# Patient Record
Sex: Male | Born: 1980 | Race: Black or African American | Hispanic: No | Marital: Married | Smoking: Current every day smoker
Health system: Southern US, Community
[De-identification: ages and names within clinical notes are randomized; demographics above are authoritative.]

## PROBLEM LIST (undated history)

## (undated) DIAGNOSIS — K259 Gastric ulcer, unspecified as acute or chronic, without hemorrhage or perforation: Secondary | ICD-10-CM

---

## 1998-03-04 ENCOUNTER — Emergency Department (HOSPITAL_COMMUNITY): Admission: EM | Admit: 1998-03-04 | Discharge: 1998-03-04 | Payer: Self-pay | Admitting: Emergency Medicine

## 2002-12-16 ENCOUNTER — Emergency Department (HOSPITAL_COMMUNITY): Admission: EM | Admit: 2002-12-16 | Discharge: 2002-12-17 | Payer: Self-pay | Admitting: Emergency Medicine

## 2002-12-17 ENCOUNTER — Encounter: Payer: Self-pay | Admitting: Emergency Medicine

## 2003-03-06 ENCOUNTER — Emergency Department (HOSPITAL_COMMUNITY): Admission: EM | Admit: 2003-03-06 | Discharge: 2003-03-06 | Payer: Self-pay | Admitting: Emergency Medicine

## 2004-08-25 ENCOUNTER — Emergency Department (HOSPITAL_COMMUNITY): Admission: EM | Admit: 2004-08-25 | Discharge: 2004-08-25 | Payer: Self-pay | Admitting: Emergency Medicine

## 2004-11-17 ENCOUNTER — Emergency Department (HOSPITAL_COMMUNITY): Admission: EM | Admit: 2004-11-17 | Discharge: 2004-11-17 | Payer: Self-pay | Admitting: Emergency Medicine

## 2004-12-14 ENCOUNTER — Emergency Department (HOSPITAL_COMMUNITY): Admission: EM | Admit: 2004-12-14 | Discharge: 2004-12-15 | Payer: Self-pay | Admitting: Emergency Medicine

## 2006-04-10 IMAGING — CT CT PELVIS W/ CM
1 of 3 series · 14 of 32 positions shown, 19 images · IV contrast (OMNI & OMNI 300 100)
Comparison: None.

CLINICAL DATA: Rectal bleeding with low back pain for the past two months.
TECHNIQUE: 100 cc Omnipaque 300 was utilized.

[Series 2: routine abdomen · axial · 0.62mm/px · z∈[-393,-43]mm · 14 of 80 slices shown, 19 images]
[im 5/80  soft-tissue]
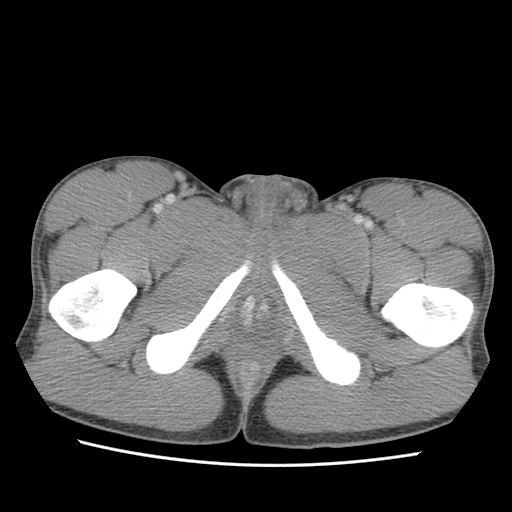
[im 5/80  bone]
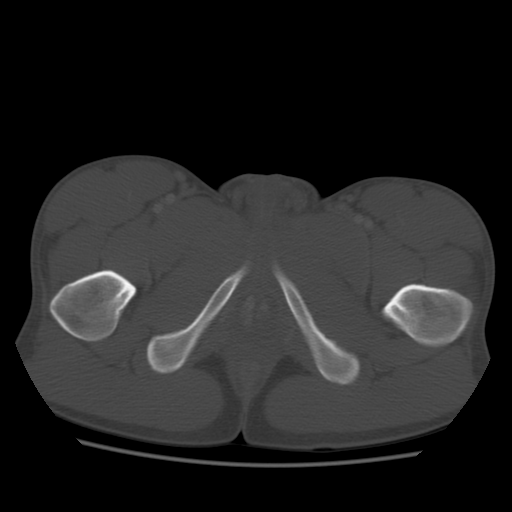
[im 13/80  soft-tissue]
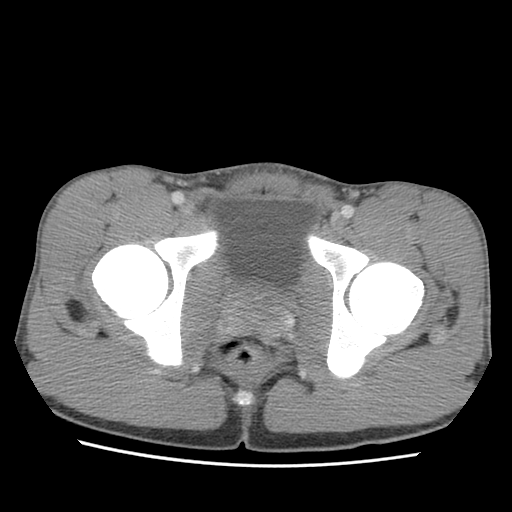
[im 17/80  soft-tissue]
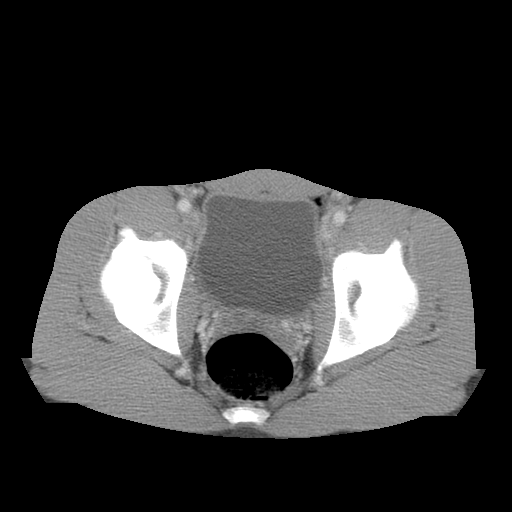
[im 21/80  soft-tissue]
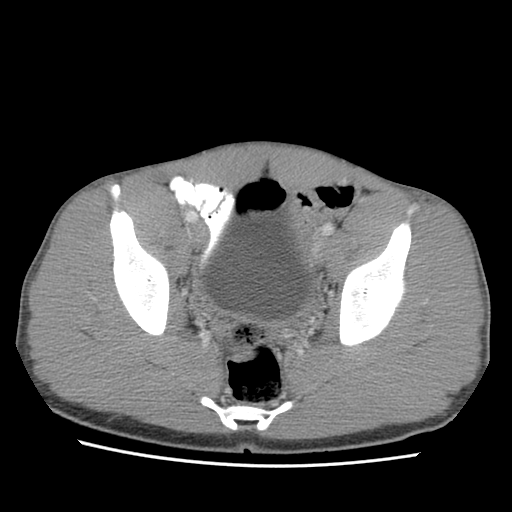
[im 30/80  soft-tissue]
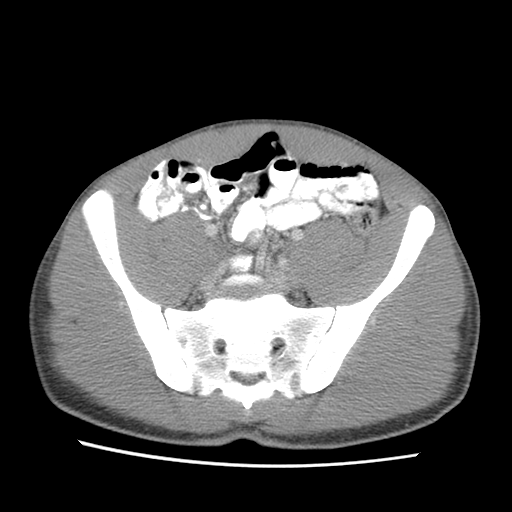
[im 34/80  soft-tissue]
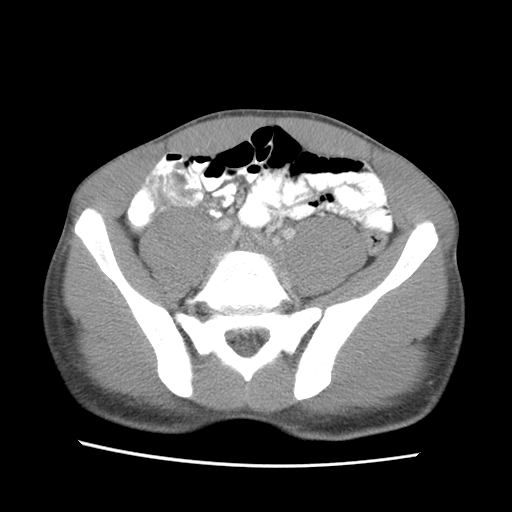
[im 42/80  soft-tissue]
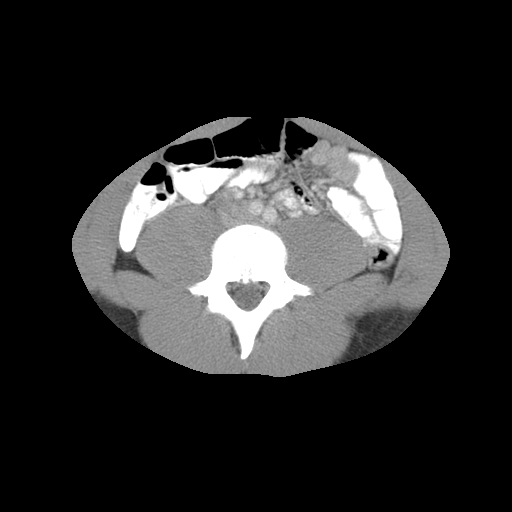
[im 46/80  soft-tissue]
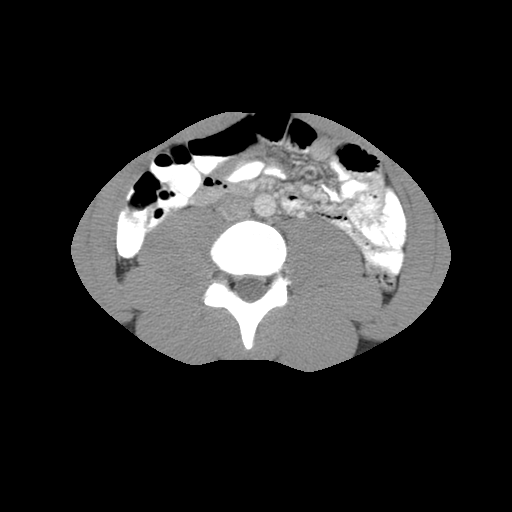
[im 50/80  soft-tissue]
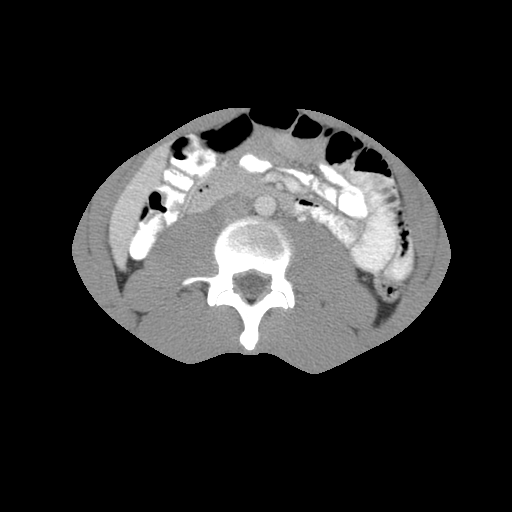
[im 50/80  bone]
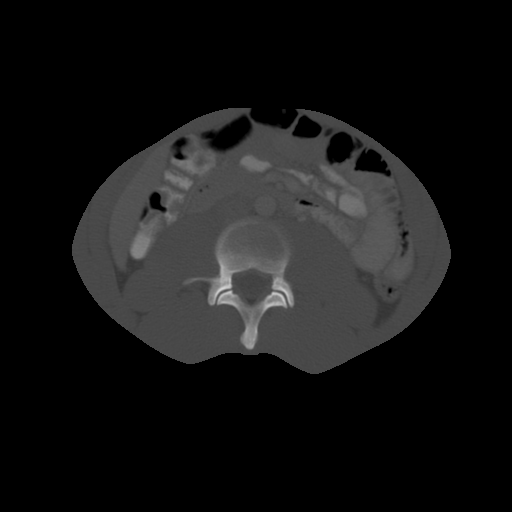
[im 59/80  soft-tissue]
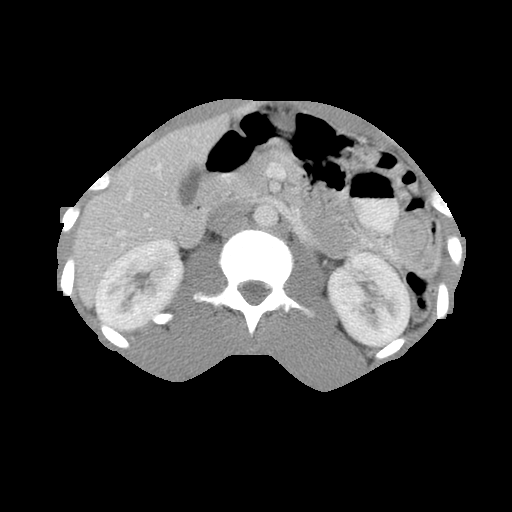
[im 63/80  soft-tissue]
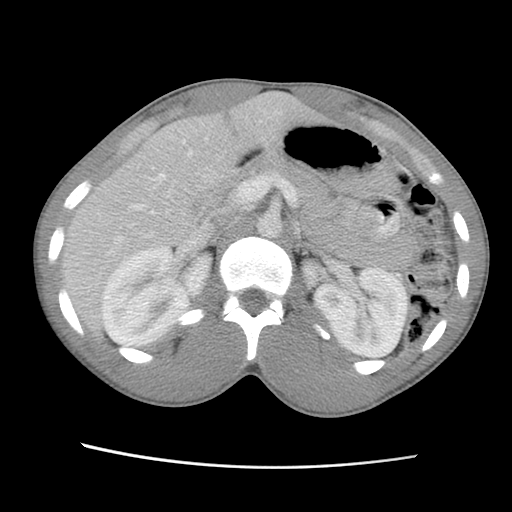
[im 63/80  lung]
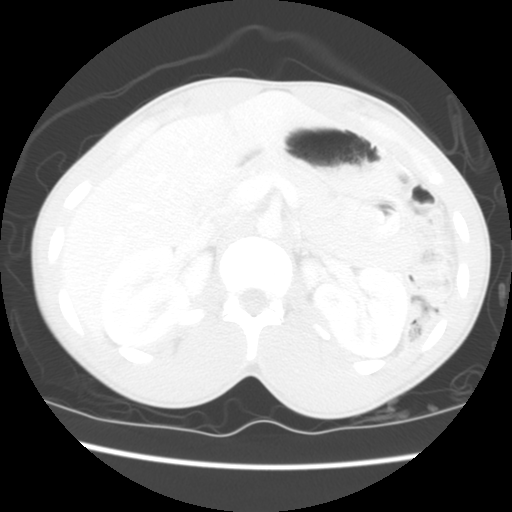
[im 67/80  soft-tissue]
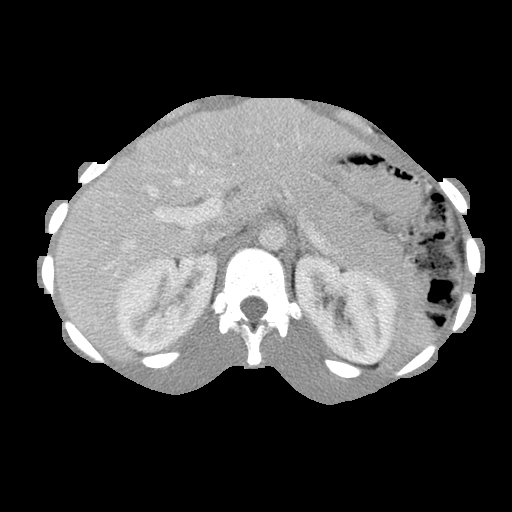
[im 67/80  lung]
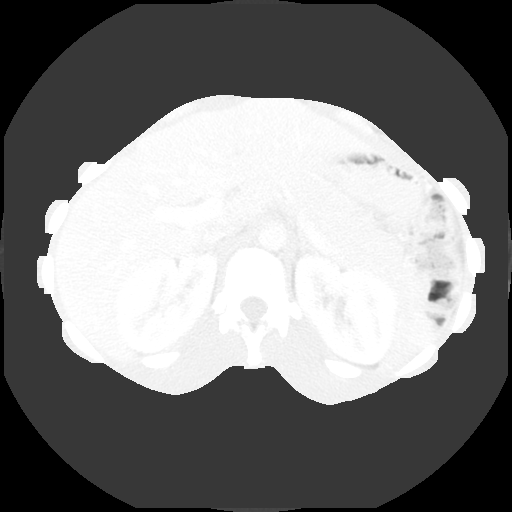
[im 71/80  lung]
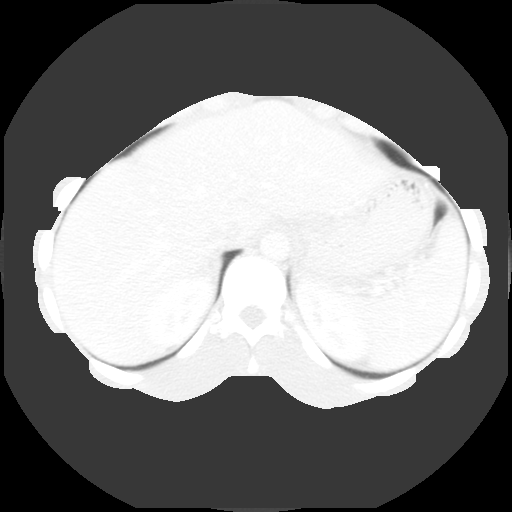
[im 75/80  soft-tissue]
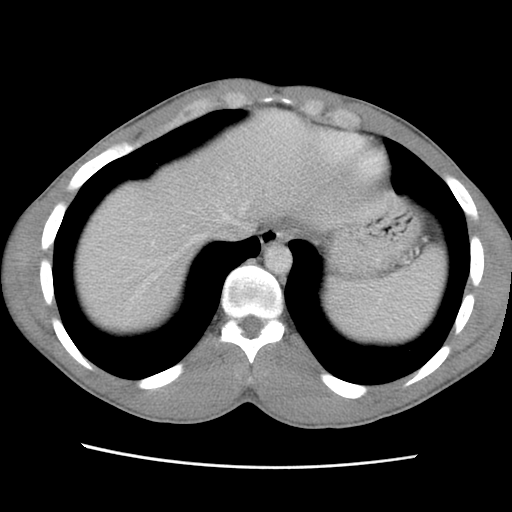
[im 75/80  lung]
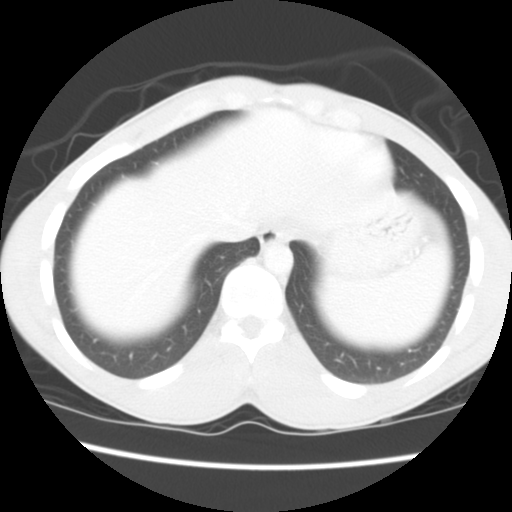

[14 of 32 positions shown; findings below may reference images not displayed]

CT ABDOMEN WITH CONTRAST:
Lung bases clear.  Liver, spleen, pancreas, adrenal glands, and kidneys are unremarkable.  No abdominal aortic aneurysm.  No bony destructive lesion.  No abnormal fluid collection or inflammatory process.  Few small bowel loops within the upper abdomen appear slightly prominent in size without thickened folds.  Significance indeterminate.
IMPRESSION: Few small bowel loops in left upper quadrant appear slightly prominent in size but without thickened folds.  Significance indeterminate.  
CT PELVIS WITH CONTRAST:
No inflammatory process detected along the appendix which fills with contrast.  Sigmoid colon is under distended and may have thickened walls related to the under distention but inflammatory process or other abnormality of such as cause for the patient?s rectal bleeding cannot be excluded.  One may consider further investigation.
IMPRESSION: Under distended sigmoid colon has slightly thickened walls which may be related to under distention as vs. inflammatory process or other abnormality.  Given the patient?s history, one may consider further investigation.

## 2007-05-10 ENCOUNTER — Emergency Department: Payer: Self-pay

## 2007-06-23 ENCOUNTER — Emergency Department (HOSPITAL_COMMUNITY): Admission: EM | Admit: 2007-06-23 | Discharge: 2007-06-23 | Payer: Self-pay | Admitting: *Deleted

## 2008-02-10 ENCOUNTER — Emergency Department (HOSPITAL_COMMUNITY): Admission: EM | Admit: 2008-02-10 | Discharge: 2008-02-10 | Payer: Self-pay | Admitting: Emergency Medicine

## 2008-10-18 ENCOUNTER — Emergency Department (HOSPITAL_COMMUNITY): Admission: EM | Admit: 2008-10-18 | Discharge: 2008-10-18 | Payer: Self-pay | Admitting: Emergency Medicine

## 2009-03-06 ENCOUNTER — Emergency Department (HOSPITAL_COMMUNITY): Admission: EM | Admit: 2009-03-06 | Discharge: 2009-03-06 | Payer: Self-pay | Admitting: Emergency Medicine

## 2009-03-09 ENCOUNTER — Emergency Department (HOSPITAL_COMMUNITY): Admission: EM | Admit: 2009-03-09 | Discharge: 2009-03-10 | Payer: Self-pay | Admitting: Emergency Medicine

## 2009-05-04 ENCOUNTER — Emergency Department (HOSPITAL_COMMUNITY): Admission: EM | Admit: 2009-05-04 | Discharge: 2009-05-04 | Payer: Self-pay | Admitting: Emergency Medicine

## 2010-01-26 ENCOUNTER — Emergency Department (HOSPITAL_COMMUNITY): Admission: EM | Admit: 2010-01-26 | Discharge: 2010-01-26 | Payer: Self-pay | Admitting: Emergency Medicine

## 2010-10-17 LAB — DIFFERENTIAL
Eosinophils Absolute: 0.1 10*3/uL (ref 0.0–0.7)
Lymphocytes Relative: 36 % (ref 12–46)
Lymphs Abs: 4.5 10*3/uL — ABNORMAL HIGH (ref 0.7–4.0)
Monocytes Relative: 5 % (ref 3–12)
Neutrophils Relative %: 58 % (ref 43–77)

## 2010-10-17 LAB — RAPID URINE DRUG SCREEN, HOSP PERFORMED
Amphetamines: NOT DETECTED
Benzodiazepines: NOT DETECTED
Tetrahydrocannabinol: POSITIVE — AB

## 2010-10-17 LAB — URINALYSIS, ROUTINE W REFLEX MICROSCOPIC
Glucose, UA: NEGATIVE mg/dL
Hgb urine dipstick: NEGATIVE
Ketones, ur: NEGATIVE mg/dL
Specific Gravity, Urine: 1.031 — ABNORMAL HIGH (ref 1.005–1.030)
Urobilinogen, UA: 1 mg/dL (ref 0.0–1.0)
pH: 6.5 (ref 5.0–8.0)

## 2010-10-17 LAB — ETHANOL: Alcohol, Ethyl (B): <5 mg/dL (ref 0–10)

## 2010-10-17 LAB — CBC
MCHC: 32.7 g/dL (ref 30.0–36.0)
MCV: 93.2 fL (ref 78.0–100.0)
Platelets: 269 10*3/uL (ref 150–400)
RBC: 4.52 MIL/uL (ref 4.22–5.81)
RDW: 14 % (ref 11.5–15.5)

## 2010-10-17 LAB — COMPREHENSIVE METABOLIC PANEL
Creatinine, Ser: 1.21 mg/dL (ref 0.4–1.5)
Glucose, Bld: 85 mg/dL (ref 70–99)
Potassium: 3.3 mEq/L — ABNORMAL LOW (ref 3.5–5.1)
Sodium: 138 mEq/L (ref 135–145)
Total Bilirubin: 0.7 mg/dL (ref 0.3–1.2)

## 2010-10-21 LAB — LIPASE, BLOOD: Lipase: 30 U/L (ref 11–59)

## 2010-10-21 LAB — COMPREHENSIVE METABOLIC PANEL
ALT: 16 U/L (ref 0–53)
Alkaline Phosphatase: 85 U/L (ref 39–117)
BUN: 8 mg/dL (ref 6–23)
Chloride: 102 mEq/L (ref 96–112)
Sodium: 139 mEq/L (ref 135–145)
Total Bilirubin: 0.7 mg/dL (ref 0.3–1.2)
Total Protein: 6.3 g/dL (ref 6.0–8.3)

## 2010-10-21 LAB — POCT I-STAT, CHEM 8
BUN: 11 mg/dL (ref 6–23)
Calcium, Ion: 1.15 mmol/L (ref 1.12–1.32)
Chloride: 100 mEq/L (ref 96–112)
Glucose, Bld: 78 mg/dL (ref 70–99)
Hemoglobin: 16 g/dL (ref 13.0–17.0)
TCO2: 30 mmol/L (ref 0–100)

## 2010-10-21 LAB — CBC
HCT: 43.6 % (ref 39.0–52.0)
Hemoglobin: 14.7 g/dL (ref 13.0–17.0)
MCHC: 33.7 g/dL (ref 30.0–36.0)
MCV: 89.7 fL (ref 78.0–100.0)
RBC: 4.86 MIL/uL (ref 4.22–5.81)
RDW: 13.1 % (ref 11.5–15.5)
WBC: 13.7 10*3/uL — ABNORMAL HIGH (ref 4.0–10.5)

## 2010-10-21 LAB — URINALYSIS, ROUTINE W REFLEX MICROSCOPIC
Bilirubin Urine: NEGATIVE
Glucose, UA: NEGATIVE mg/dL
Protein, ur: NEGATIVE mg/dL
Urobilinogen, UA: 1 mg/dL (ref 0.0–1.0)

## 2010-10-21 LAB — URINE MICROSCOPIC-ADD ON

## 2015-10-29 ENCOUNTER — Emergency Department (HOSPITAL_COMMUNITY)
Admission: EM | Admit: 2015-10-29 | Discharge: 2015-10-29 | Disposition: A | Discharge status: Home / Self Care | Payer: Self-pay | Attending: Emergency Medicine | Admitting: Emergency Medicine

## 2015-10-29 ENCOUNTER — Encounter (HOSPITAL_COMMUNITY): Payer: Self-pay | Admitting: Emergency Medicine

## 2015-10-29 ENCOUNTER — Emergency Department (HOSPITAL_COMMUNITY): Payer: Self-pay

## 2015-10-29 DIAGNOSIS — B349 Viral infection, unspecified: Secondary | ICD-10-CM | POA: Insufficient documentation

## 2015-10-29 DIAGNOSIS — F17200 Nicotine dependence, unspecified, uncomplicated: Secondary | ICD-10-CM | POA: Insufficient documentation

## 2015-10-29 DIAGNOSIS — F1410 Cocaine abuse, uncomplicated: Secondary | ICD-10-CM | POA: Insufficient documentation

## 2015-10-29 DIAGNOSIS — F1210 Cannabis abuse, uncomplicated: Secondary | ICD-10-CM | POA: Insufficient documentation

## 2015-10-29 DIAGNOSIS — Z8719 Personal history of other diseases of the digestive system: Secondary | ICD-10-CM | POA: Insufficient documentation

## 2015-10-29 HISTORY — DX: Gastric ulcer, unspecified as acute or chronic, without hemorrhage or perforation: K25.9

## 2015-10-29 LAB — RAPID URINE DRUG SCREEN, HOSP PERFORMED
AMPHETAMINES: NOT DETECTED
BARBITURATES: NOT DETECTED
BENZODIAZEPINES: NOT DETECTED
Cocaine: POSITIVE — AB
Opiates: NOT DETECTED
Tetrahydrocannabinol: POSITIVE — AB

## 2015-10-29 LAB — URINALYSIS, ROUTINE W REFLEX MICROSCOPIC
BILIRUBIN URINE: NEGATIVE
GLUCOSE, UA: NEGATIVE mg/dL
Hgb urine dipstick: NEGATIVE
KETONES UR: 15 mg/dL — AB
Leukocytes, UA: NEGATIVE
NITRITE: NEGATIVE
PH: 7.5 (ref 5.0–8.0)
PROTEIN: NEGATIVE mg/dL
SPECIFIC GRAVITY, URINE: 1.014 (ref 1.005–1.030)

## 2015-10-29 LAB — CBC
HEMATOCRIT: 45.4 % (ref 39.0–52.0)
Hemoglobin: 14.9 g/dL (ref 13.0–17.0)
MCH: 29.3 pg (ref 26.0–34.0)
MCHC: 32.8 g/dL (ref 30.0–36.0)
MCV: 89.2 fL (ref 78.0–100.0)
Platelets: 333 10*3/uL (ref 150–400)
RBC: 5.09 MIL/uL (ref 4.22–5.81)
RDW: 12.9 % (ref 11.5–15.5)
WBC: 18.7 10*3/uL — AB (ref 4.0–10.5)

## 2015-10-29 LAB — COMPREHENSIVE METABOLIC PANEL
ALBUMIN: 4 g/dL (ref 3.5–5.0)
ALT: 17 U/L (ref 17–63)
AST: 21 U/L (ref 15–41)
Alkaline Phosphatase: 63 U/L (ref 38–126)
Anion gap: 11 (ref 5–15)
CHLORIDE: 101 mmol/L (ref 101–111)
CO2: 28 mmol/L (ref 22–32)
Calcium: 9.4 mg/dL (ref 8.9–10.3)
Creatinine, Ser: 1.08 mg/dL (ref 0.61–1.24)
GFR calc Af Amer: >60 mL/min (ref >60)
GLUCOSE: 133 mg/dL — AB (ref 65–99)
POTASSIUM: 3.2 mmol/L — AB (ref 3.5–5.1)
SODIUM: 140 mmol/L (ref 135–145)
Total Bilirubin: 0.8 mg/dL (ref 0.3–1.2)
Total Protein: 7.2 g/dL (ref 6.5–8.1)

## 2015-10-29 LAB — I-STAT CG4 LACTIC ACID, ED: Lactic Acid, Venous: 1.78 mmol/L (ref 0.5–2.0)

## 2015-10-29 LAB — LIPASE, BLOOD: LIPASE: 25 U/L (ref 11–51)

## 2015-10-29 MED ORDER — SODIUM CHLORIDE 0.9 % IV BOLUS (SEPSIS)
1000.0000 mL | Freq: Once | INTRAVENOUS | Status: AC
  Administered 2015-10-29: 1000 mL via INTRAVENOUS

## 2015-10-29 MED ORDER — PROCHLORPERAZINE EDISYLATE 5 MG/ML IJ SOLN
10.0000 mg | Freq: Once | INTRAMUSCULAR | Status: AC
  Administered 2015-10-29: 10 mg via INTRAVENOUS
  Filled 2015-10-29: qty 2

## 2015-10-29 MED ORDER — GI COCKTAIL ~~LOC~~
30.0000 mL | Freq: Once | ORAL | Status: AC
  Administered 2015-10-29: 30 mL via ORAL
  Filled 2015-10-29: qty 30

## 2015-10-29 MED ORDER — KETOROLAC TROMETHAMINE 30 MG/ML IJ SOLN
30.0000 mg | Freq: Once | INTRAMUSCULAR | Status: AC
  Administered 2015-10-29: 30 mg via INTRAVENOUS
  Filled 2015-10-29: qty 1

## 2015-10-29 MED ORDER — PSEUDOEPHEDRINE HCL 30 MG PO TABS: 30.0000 mg | ORAL_TABLET | ORAL | Status: DC | PRN

## 2015-10-29 MED ORDER — FAMOTIDINE 20 MG PO TABS: 20.0000 mg | ORAL_TABLET | Freq: Two times a day (BID) | ORAL | Status: DC

## 2015-10-29 MED ORDER — ONDANSETRON 8 MG PO TBDP: 8.0000 mg | ORAL_TABLET | Freq: Three times a day (TID) | ORAL | Status: DC | PRN

## 2015-10-29 NOTE — ED Notes (Signed)
Pt sts abd pain and nasal congestion with sinus pressure x 3 days

## 2015-10-29 NOTE — ED Provider Notes (Signed)
CSN: [id number]     Arrival date & time 10/29/15  [id number] History   First MD Initiated Contact with Patient 10/29/15 1121     Chief Complaint  Patient presents with  . Abdominal Pain  . Nasal Congestion     (Consider location/radiation/quality/duration/timing/severity/associated sxs/prior Treatment) HPI Duane Boone J Pocock is a 35 y.o. male with hx of gastric ulcers, presents to ED with complaint of headache, nasal congestion, sore throat, nausea, vomiting, diarrhea abdominal pain. States headache and sinus pressure started about 3 days ago, abdominal pain about a week ago. States hx of abdominal issues, states was told he had gastric ulcers. Denies blood in his stool or emesis. Denies urinary symptoms. No neck pain or stiffness. No treatment prior to coming in. Nothing making his symptoms better or worse. Patient rates his headache as 10 out of 10. States gradual onset. No history of the same. No recent sick contacts.  Past Medical History  Diagnosis Date  . Gastric ulcer    History reviewed. No pertinent past surgical history. History reviewed. No pertinent family history. Social History  Substance Use Topics  . Smoking status: Current Every Day Smoker  . Smokeless tobacco: Duane Boone  . Alcohol Use: Yes    Review of Systems  Constitutional: Negative for fever and chills.  HENT: Positive for congestion, facial swelling, sinus pressure and sore throat.   Respiratory: Negative for cough, chest tightness and shortness of breath.   Cardiovascular: Negative for chest pain, palpitations and leg swelling.  Gastrointestinal: Positive for nausea, vomiting, abdominal pain and diarrhea. Negative for abdominal distention.  Genitourinary: Negative for dysuria, urgency, frequency and hematuria.  Musculoskeletal: Negative for myalgias, arthralgias, neck pain and neck stiffness.  Skin: Negative for rash.  Allergic/Immunologic: Negative for immunocompromised state.  Neurological: Positive for headaches.  Negative for dizziness, weakness, light-headedness and numbness.  All other systems reviewed and are negative.     Allergies  Review of patient's allergies indicates no known allergies.  Home Medications   Prior to Admission medications   Not on File   BP 139/93 mmHg  Pulse 107  Temp(Src) 98.9 F (37.2 C) (Oral)  Resp 18  Ht 5\' 8"  (1.727 m)  Wt 77.111 kg  BMI 25.85 kg/m2  SpO2 100% Physical Exam  Constitutional: He is oriented to person, place, and time. He appears well-developed and well-nourished. No distress.  HENT:  Head: Normocephalic and atraumatic.  Right Ear: Tympanic membrane, external ear and ear canal normal.  Left Ear: Tympanic membrane, external ear and ear canal normal.  Nose: Mucosal edema and rhinorrhea present. Right sinus exhibits maxillary sinus tenderness and frontal sinus tenderness. Left sinus exhibits maxillary sinus tenderness and frontal sinus tenderness.  Mouth/Throat: Uvula is midline, oropharynx is clear and moist and mucous membranes are normal.  Eyes: Conjunctivae are normal.  Neck: Normal range of motion. Neck supple.  No meningismus  Cardiovascular: Normal rate, regular rhythm and normal heart sounds.   Pulmonary/Chest: Effort normal. No respiratory distress. He has no wheezes. He has no rales.  Abdominal: Soft. Bowel sounds are normal. He exhibits no distension. There is no tenderness. There is no rebound.  Epigastric tenderness. No guarding  Musculoskeletal: He exhibits no edema.  Neurological: He is alert and oriented to person, place, and time.  Skin: Skin is warm and dry.  Nursing note and vitals reviewed.   ED Course  Procedures (including critical care time) Labs Review Labs Reviewed  COMPREHENSIVE METABOLIC PANEL - Abnormal; Notable for the following:    Potassium  3.2 (*)    Glucose, Bld 133 (*)    BUN <5 (*)    All other components within normal limits  CBC - Abnormal; Notable for the following:    WBC 18.7 (*)    All  other components within normal limits  URINALYSIS, ROUTINE W REFLEX MICROSCOPIC (NOT AT [facility]) - Abnormal; Notable for the following:    APPearance CLOUDY (*)    Ketones, ur 15 (*)    All other components within normal limits  URINE RAPID DRUG SCREEN, HOSP PERFORMED - Abnormal; Notable for the following:    Cocaine POSITIVE (*)    Tetrahydrocannabinol POSITIVE (*)    All other components within normal limits  LIPASE, BLOOD  I-STAT CG4 LACTIC ACID, ED    Imaging Review Dg Abd Acute W/chest  10/29/2015  CLINICAL DATA:  35 year old male with lower abdominal pain for 2 days. Initial encounter. EXAM: DG ABDOMEN ACUTE W/ 1V CHEST COMPARISON:  Chest radiographs 10/18/2008. CT Abdomen and Pelvis 11/17/2004. FINDINGS: Seated upright view of the chest. Lower lung volumes, but the lungs remain clear. Normal cardiac size and mediastinal contours. No pneumothorax or pneumoperitoneum. Non obstructed bowel gas pattern. No pneumoperitoneum. Small left hemipelvis phleboliths re- demonstrated. Abdominal and pelvic visceral contours appear normal. Mild thoracic scoliosis. No acute osseous abnormality identified. IMPRESSION: 1.  Normal bowel gas pattern, no free air. 2. Negative chest. Electronically Signed   By: Odessa Fleming M.D.   On: 10/29/2015 13:34   I have personally reviewed and evaluated these images and lab results as part of my medical decision-making.   EKG Interpretation Duane Boone      MDM   Final diagnoses:  Viral syndrome   Pt with flu like symptoms, sore throat, congestion, abdominal pain, nausea, vomiting, diarrhea. Afebrile here. Slightly tachycardic. Pt appears very solmnolent. Will get labs. Iv fluids started. Compazine and toradol ordered for headache and nausea   3:11 PM Pt feels much better. He is drinking water. Headache improved. vS now normal. Most likely viral syndrome. Abdomen is soft and benign. Acute abdomen is negative. We'll discharge home in stable condition. Return precautions  discussed. Will prescribe Zofran for nausea.  Filed Vitals:   10/29/15 0947 10/29/15 1423  BP: 139/93 104/58  Pulse: 107 76  Temp: 98.9 F (37.2 C)   TempSrc: Oral   Resp: 18 14  Height:  (1.727 m)   Weight: 77.111 kg   SpO2: 100% 99%      Jaynie Crumble, PA-C 10/29/15 1637  Melene Plan, DO 10/31/15 1610

## 2015-10-29 NOTE — Discharge Instructions (Signed)
zofran as prescribed as needed for nausea. Drink plenty of fluids. Sudafed for congestion. Ibuprofen and tylenol for headache. Follow up with primary care doctor for recheck.   Viral Gastroenteritis Viral gastroenteritis is also known as stomach flu. This condition affects the stomach and intestinal tract. It can cause sudden diarrhea and vomiting. The illness typically lasts 3 to 8 days. Most people develop an immune response that eventually gets rid of the virus. While this natural response develops, the virus can make you quite ill. CAUSES  Many different viruses can cause gastroenteritis, such as rotavirus or noroviruses. You can catch one of these viruses by consuming contaminated food or water. You may also catch a virus by sharing utensils or other personal items with an infected person or by touching a contaminated surface. SYMPTOMS  The most common symptoms are diarrhea and vomiting. These problems can cause a severe loss of body fluids (dehydration) and a body salt (electrolyte) imbalance. Other symptoms may include:  Fever.  Headache.  Fatigue.  Abdominal pain. DIAGNOSIS  Your caregiver can usually diagnose viral gastroenteritis based on your symptoms and a physical exam. A stool sample may also be taken to test for the presence of viruses or other infections. TREATMENT  This illness typically goes away on its own. Treatments are aimed at rehydration. The most serious cases of viral gastroenteritis involve vomiting so severely that you are not able to keep fluids down. In these cases, fluids must be given through an intravenous line (IV). HOME CARE INSTRUCTIONS   Drink enough fluids to keep your urine clear or pale yellow. Drink small amounts of fluids frequently and increase the amounts as tolerated.  Ask your caregiver for specific rehydration instructions.  Avoid:  Foods high in sugar.  Alcohol.  Carbonated drinks.  Tobacco.  Juice.  Caffeine drinks.  Extremely  hot or cold fluids.  Fatty, greasy foods.  Too much intake of anything at one time.  Dairy products until 24 to 48 hours after diarrhea stops.  You may consume probiotics. Probiotics are active cultures of beneficial bacteria. They may lessen the amount and number of diarrheal stools in adults. Probiotics can be found in yogurt with active cultures and in supplements.  Wash your hands well to avoid spreading the virus.  Only take over-the-counter or prescription medicines for pain, discomfort, or fever as directed by your caregiver. Do not give aspirin to children. Antidiarrheal medicines are not recommended.  Ask your caregiver if you should continue to take your regular prescribed and over-the-counter medicines.  Keep all follow-up appointments as directed by your caregiver. SEEK IMMEDIATE MEDICAL CARE IF:   You are unable to keep fluids down.  You do not urinate at least once every 6 to 8 hours.  You develop shortness of breath.  You notice blood in your stool or vomit. This may look like coffee grounds.  You have abdominal pain that increases or is concentrated in one small area (localized).  You have persistent vomiting or diarrhea.  You have a fever.  The patient is a child younger than 3 months, and he or she has a fever.  The patient is a child older than 3 months, and he or she has a fever and persistent symptoms.  The patient is a child older than 3 months, and he or she has a fever and symptoms suddenly get worse.  The patient is a baby, and he or she has no tears when crying. MAKE SURE YOU:   Understand these instructions.  Will watch your condition.  Will get help right away if you are not doing well or get worse.   This information is not intended to replace advice given to you by your health care provider. Make sure you discuss any questions you have with your health care provider.   Document Released: 06/28/2005 Document Revised: 09/20/2011 Document  Reviewed: 04/14/2011 Elsevier Interactive Patient Education [employer]2016 [employer]

## 2016-02-27 ENCOUNTER — Encounter (HOSPITAL_COMMUNITY): Payer: Self-pay

## 2016-02-27 ENCOUNTER — Emergency Department (HOSPITAL_COMMUNITY)
Admission: EM | Admit: 2016-02-27 | Discharge: 2016-02-27 | Disposition: A | Discharge status: Home / Self Care | Payer: Self-pay | Attending: Emergency Medicine | Admitting: Emergency Medicine

## 2016-02-27 DIAGNOSIS — F17200 Nicotine dependence, unspecified, uncomplicated: Secondary | ICD-10-CM | POA: Insufficient documentation

## 2016-02-27 DIAGNOSIS — J302 Other seasonal allergic rhinitis: Secondary | ICD-10-CM | POA: Insufficient documentation

## 2016-02-27 DIAGNOSIS — T50905A Adverse effect of unspecified drugs, medicaments and biological substances, initial encounter: Secondary | ICD-10-CM

## 2016-02-27 DIAGNOSIS — T491X5A Adverse effect of antipruritics, initial encounter: Secondary | ICD-10-CM | POA: Insufficient documentation

## 2016-02-27 MED ORDER — KETOROLAC TROMETHAMINE 60 MG/2ML IM SOLN
60.0000 mg | Freq: Once | INTRAMUSCULAR | Status: AC
  Administered 2016-02-27: 60 mg via INTRAMUSCULAR
  Filled 2016-02-27: qty 2

## 2016-02-27 MED ORDER — METOCLOPRAMIDE HCL 10 MG PO TABS: 10.0000 mg | ORAL_TABLET | Freq: Four times a day (QID) | ORAL | 0 refills | Status: DC

## 2016-02-27 MED ORDER — METOCLOPRAMIDE HCL 10 MG PO TABS
10.0000 mg | ORAL_TABLET | Freq: Once | ORAL | Status: AC
  Administered 2016-02-27: 10 mg via ORAL
  Filled 2016-02-27: qty 1

## 2016-02-27 MED ORDER — PSEUDOEPHEDRINE HCL 60 MG PO TABS
60.0000 mg | ORAL_TABLET | ORAL | Status: AC
  Administered 2016-02-27: 60 mg via ORAL
  Filled 2016-02-27: qty 1

## 2016-02-27 MED ORDER — LORATADINE-PSEUDOEPHEDRINE ER 5-120 MG PO TB12: 1.0000 | ORAL_TABLET | Freq: Two times a day (BID) | ORAL | 0 refills | Status: DC

## 2016-02-27 MED ORDER — IPRATROPIUM BROMIDE 0.03 % NA SOLN: 2.0000 | Freq: Two times a day (BID) | NASAL | 12 refills | Status: DC

## 2016-02-27 MED ORDER — LORATADINE 10 MG PO TABS
10.0000 mg | ORAL_TABLET | Freq: Once | ORAL | Status: AC
  Administered 2016-02-27: 10 mg via ORAL
  Filled 2016-02-27: qty 1

## 2016-02-27 NOTE — ED Triage Notes (Signed)
Pt has been having sinus problems and migraines on and off for the pas seven months. States he sometimes coughs up green phlem. Migraine started today around 5am with photophobia, denies nausea, denies fevers.

## 2016-02-27 NOTE — ED Provider Notes (Signed)
[facility]-EMERGENCY DEPT Provider Note   CSN: [id number] Arrival date & time: 02/27/16  0236     History   Chief Complaint Chief Complaint  Patient presents with  . Migraine  . Sinus Problem    HPI Duane Boone is a 35 y.o. male.  The history is provided by the patient. No language interpreter was used.  Migraine  This is a new problem. Episode onset: 8 months. The problem occurs constantly. The problem has not changed since onset.Associated symptoms include headaches. Pertinent negatives include no chest pain. Nothing aggravates the symptoms. Nothing relieves the symptoms. He has tried nothing for the symptoms. The treatment provided no relief.  Sinus Problem  Associated symptoms include headaches. Pertinent negatives include no chest pain.    Past Medical History:  Diagnosis Date  . Gastric ulcer     There are no active problems to display for this patient.   History reviewed. No pertinent surgical history.     Home Medications    Prior to Admission medications   Medication Sig Start Date End Date Taking? Authorizing Provider  famotidine (PEPCID) 20 MG tablet Take 1 tablet (20 mg total) by mouth 2 (two) times daily. 10/29/15   Tatyana Kirichenko, PA-C  ibuprofen (ADVIL,MOTRIN) 200 MG tablet Take 200 mg by mouth every 6 (six) hours as needed for moderate pain.    Historical Provider, MD  ipratropium (ATROVENT) 0.03 % nasal spray Place 2 sprays into both nostrils every 12 (twelve) hours. 02/27/16   Srinika Delone, MD  loratadine-pseudoephedrine (CLARITIN-D 12 HOUR) 5-120 MG tablet Take 1 tablet by mouth 2 (two) times daily. 02/27/16   Ryeleigh Santore, MD  metoCLOPramide (REGLAN) 10 MG tablet Take 1 tablet (10 mg total) by mouth every 6 (six) hours. 02/27/16   Shirley Bolle, MD  ondansetron (ZOFRAN ODT) 8 MG disintegrating tablet Take 1 tablet (8 mg total) by mouth every 8 (eight) hours as needed for nausea or vomiting. 10/29/15   Tatyana Kirichenko, PA-C  pseudoephedrine  (SUDAFED) 30 MG tablet Take 1 tablet (30 mg total) by mouth every 4 (four) hours as needed for congestion. 10/29/15   Jaynie Crumbleatyana Kirichenko, PA-C    Family History No family history on file.  Social History Social History  Substance Use Topics  . Smoking status: Current Every Day Smoker  . Smokeless tobacco: Never Used  . Alcohol use Yes     Allergies   Review of patient's allergies indicates no known allergies.   Review of Systems Review of Systems  Constitutional: Negative for fever.  Cardiovascular: Negative for chest pain.  Musculoskeletal: Negative for neck pain and neck stiffness.  Neurological: Positive for headaches. Negative for seizures, speech difficulty and weakness.  All other systems reviewed and are negative.    Physical Exam Updated Vital Signs BP 95/70   Pulse 64   Temp 98.2 F (36.8 C) (Oral)   Resp 18   Ht 5\' 7"  (1.702 m)   Wt 160 lb (72.6 kg)   SpO2 99%   BMI 25.06 kg/m   Physical Exam  Constitutional: He is oriented to person, place, and time. He appears well-developed and well-nourished. No distress.  HENT:  Head: Normocephalic and atraumatic.  Nose: Nose normal.  Mouth/Throat: Oropharynx is clear and moist. No oropharyngeal exudate.  Eyes: Conjunctivae and EOM are normal. Pupils are equal, round, and reactive to light.  Neck: Trachea normal, normal range of motion and phonation normal. Neck supple. No tracheal tenderness and no spinous process tenderness present. No neck rigidity.  No tracheal deviation and normal range of motion present. No thyromegaly present.  Cardiovascular: Normal rate, regular rhythm, normal heart sounds and intact distal pulses.   Pulmonary/Chest: Effort normal and breath sounds normal. No stridor. He has no wheezes. He has no rales.  Abdominal: Soft. Bowel sounds are normal. He exhibits no mass. There is no tenderness. There is no rebound and no guarding.  Musculoskeletal: Normal range of motion.  Lymphadenopathy:    He  has no cervical adenopathy.  Neurological: He is alert and oriented to person, place, and time. He has normal reflexes. He displays normal reflexes. No cranial nerve deficit.  Skin: Skin is warm and dry. Capillary refill takes less than 2 seconds.  Psychiatric: He has a normal mood and affect.     ED Treatments / Results  Labs (all labs ordered are listed, but only abnormal results are displayed) Labs Reviewed - No data to display  EKG  EKG Interpretation None       Radiology No results found.  Procedures Procedures (including critical care time)  Medications Ordered in ED Medications  pseudoephedrine (SUDAFED) tablet 60 mg (60 mg Oral Given 02/27/16 0543)  ketorolac (TORADOL) injection 60 mg (60 mg Intramuscular Given 02/27/16 0542)  loratadine (CLARITIN) tablet 10 mg (10 mg Oral Given 02/27/16 0543)  metoCLOPramide (REGLAN) tablet 10 mg (10 mg Oral Given 02/27/16 0542)     Initial Impression / Assessment and Plan / ED Course  I have reviewed the triage vital signs and the nursing notes.  Pertinent labs & imaging results that were available during my care of the patient were reviewed by me and considered in my medical decision making (see chart for details).  Clinical Course      Final Clinical Impressions(s) / ED Diagnoses   Final diagnoses:  Other seasonal allergic rhinitis  Medication reaction, initial encounter   Vitals:   02/27/16 0645 02/27/16 0655  BP: 101/75   Pulse: 70   Resp:    Temp:  98.5 F (36.9 C)   Medications  pseudoephedrine (SUDAFED) tablet 60 mg (60 mg Oral Given 02/27/16 0543)  ketorolac (TORADOL) injection 60 mg (60 mg Intramuscular Given 02/27/16 0542)  loratadine (CLARITIN) tablet 10 mg (10 mg Oral Given 02/27/16 0543)  metoCLOPramide (REGLAN) tablet 10 mg (10 mg Oral Given 02/27/16 0542)   Well appearing ongoing sx for 8 months+ without fever.  ETC.  Overusing his flonase and causing rebound symptoms.  Pain free post medication.  All  questions answered to patient's satisfaction. Based on history and exam patient has been appropriately medically screened and emergency conditions excluded. Patient is stable for discharge at this time. Follow up with your PMDfor recheck in 2 daysand strict return precautions given.  New Prescriptions New Prescriptions   IPRATROPIUM (ATROVENT) 0.03 % NASAL SPRAY    Place 2 sprays into both nostrils every 12 (twelve) hours.   LORATADINE-PSEUDOEPHEDRINE (CLARITIN-D 12 HOUR) 5-120 MG TABLET    Take 1 tablet by mouth 2 (two) times daily.   METOCLOPRAMIDE (REGLAN) 10 MG TABLET    Take 1 tablet (10 mg total) by mouth every 6 (six) hours.     Cy BlamerApril Arelie Kuzel, MD 02/27/16 [phone]

## 2016-06-09 ENCOUNTER — Emergency Department (HOSPITAL_COMMUNITY)
Admission: EM | Admit: 2016-06-09 | Discharge: 2016-06-09 | Disposition: A | Discharge status: Home / Self Care | Payer: Self-pay | Attending: Emergency Medicine | Admitting: Emergency Medicine

## 2016-06-09 ENCOUNTER — Encounter (HOSPITAL_COMMUNITY): Payer: Self-pay

## 2016-06-09 DIAGNOSIS — K921 Melena: Secondary | ICD-10-CM | POA: Insufficient documentation

## 2016-06-09 DIAGNOSIS — F17200 Nicotine dependence, unspecified, uncomplicated: Secondary | ICD-10-CM | POA: Insufficient documentation

## 2016-06-09 DIAGNOSIS — Z5321 Procedure and treatment not carried out due to patient leaving prior to being seen by health care provider: Secondary | ICD-10-CM | POA: Insufficient documentation

## 2016-06-09 LAB — COMPREHENSIVE METABOLIC PANEL
ALBUMIN: 3.5 g/dL (ref 3.5–5.0)
ALK PHOS: 65 U/L (ref 38–126)
ALT: 22 U/L (ref 17–63)
ANION GAP: 9 (ref 5–15)
AST: 22 U/L (ref 15–41)
BUN: 7 mg/dL (ref 6–20)
CALCIUM: 9 mg/dL (ref 8.9–10.3)
CHLORIDE: 103 mmol/L (ref 101–111)
CO2: 27 mmol/L (ref 22–32)
Creatinine, Ser: 1.15 mg/dL (ref 0.61–1.24)
GFR calc non Af Amer: >60 mL/min (ref >60)
GLUCOSE: 93 mg/dL (ref 65–99)
Potassium: 3.9 mmol/L (ref 3.5–5.1)
SODIUM: 139 mmol/L (ref 135–145)
Total Bilirubin: 0.3 mg/dL (ref 0.3–1.2)
Total Protein: 6 g/dL — ABNORMAL LOW (ref 6.5–8.1)

## 2016-06-09 LAB — CBC
HEMATOCRIT: 42.6 % (ref 39.0–52.0)
HEMOGLOBIN: 14 g/dL (ref 13.0–17.0)
MCH: 28.9 pg (ref 26.0–34.0)
MCHC: 32.9 g/dL (ref 30.0–36.0)
MCV: 88 fL (ref 78.0–100.0)
Platelets: 354 10*3/uL (ref 150–400)
RBC: 4.84 MIL/uL (ref 4.22–5.81)
RDW: 14.1 % (ref 11.5–15.5)
WBC: 12.2 10*3/uL — ABNORMAL HIGH (ref 4.0–10.5)

## 2016-06-09 LAB — LIPASE, BLOOD: LIPASE: 35 U/L (ref 11–51)

## 2016-06-09 NOTE — ED Notes (Signed)
No answer x3

## 2016-06-09 NOTE — ED Provider Notes (Signed)
Patient left without being seen  1. Patient left without being seen       Melene Planan Debra Colon, DO 06/09/16 1705

## 2016-06-09 NOTE — ED Notes (Signed)
Pt called for vitals recheck x2. No answer.  

## 2016-06-09 NOTE — ED Triage Notes (Signed)
Patient complains of intermittent abdominal cramping and irritation with bloody stools. States that he was drinking ETOH daily but has had none x 3-4 days. No vomiting, NAD. Alert and oriented, skin dry

## 2016-06-10 ENCOUNTER — Emergency Department (HOSPITAL_COMMUNITY)
Admission: EM | Admit: 2016-06-10 | Discharge: 2016-06-10 | Disposition: A | Discharge status: Home / Self Care | Payer: Self-pay | Attending: Emergency Medicine | Admitting: Emergency Medicine

## 2016-06-10 ENCOUNTER — Encounter (HOSPITAL_COMMUNITY): Payer: Self-pay

## 2016-06-10 DIAGNOSIS — K625 Hemorrhage of anus and rectum: Secondary | ICD-10-CM | POA: Insufficient documentation

## 2016-06-10 DIAGNOSIS — K2970 Gastritis, unspecified, without bleeding: Secondary | ICD-10-CM | POA: Insufficient documentation

## 2016-06-10 DIAGNOSIS — F17200 Nicotine dependence, unspecified, uncomplicated: Secondary | ICD-10-CM | POA: Insufficient documentation

## 2016-06-10 LAB — CBC WITH DIFFERENTIAL/PLATELET
BASOS ABS: 0 10*3/uL (ref 0.0–0.1)
Basophils Relative: 0 %
EOS ABS: 0.1 10*3/uL (ref 0.0–0.7)
Eosinophils Relative: 1 %
HEMATOCRIT: 40.7 % (ref 39.0–52.0)
HEMOGLOBIN: 13.3 g/dL (ref 13.0–17.0)
LYMPHS PCT: 23 %
Lymphs Abs: 3 10*3/uL (ref 0.7–4.0)
MCH: 28.8 pg (ref 26.0–34.0)
MCHC: 32.7 g/dL (ref 30.0–36.0)
MCV: 88.1 fL (ref 78.0–100.0)
MONOS PCT: 6 %
Monocytes Absolute: 0.8 10*3/uL (ref 0.1–1.0)
NEUTROS ABS: 9.2 10*3/uL — AB (ref 1.7–7.7)
NEUTROS PCT: 70 %
Platelets: 350 10*3/uL (ref 150–400)
RBC: 4.62 MIL/uL (ref 4.22–5.81)
RDW: 14.1 % (ref 11.5–15.5)
WBC: 13.1 10*3/uL — ABNORMAL HIGH (ref 4.0–10.5)

## 2016-06-10 LAB — URINE MICROSCOPIC-ADD ON
BACTERIA UA: NONE SEEN
RBC / HPF: NONE SEEN RBC/hpf (ref 0–5)
Squamous Epithelial / LPF: NONE SEEN
WBC UA: NONE SEEN WBC/hpf (ref 0–5)

## 2016-06-10 LAB — URINALYSIS, ROUTINE W REFLEX MICROSCOPIC
Bilirubin Urine: NEGATIVE
GLUCOSE, UA: NEGATIVE mg/dL
Hgb urine dipstick: NEGATIVE
KETONES UR: NEGATIVE mg/dL
LEUKOCYTES UA: NEGATIVE
NITRITE: NEGATIVE
PH: 8 (ref 5.0–8.0)
Protein, ur: NEGATIVE mg/dL
SPECIFIC GRAVITY, URINE: 1.011 (ref 1.005–1.030)

## 2016-06-10 LAB — I-STAT CHEM 8, ED
BUN: 4 mg/dL — ABNORMAL LOW (ref 6–20)
CHLORIDE: 100 mmol/L — AB (ref 101–111)
CREATININE: 1.1 mg/dL (ref 0.61–1.24)
Calcium, Ion: 1.12 mmol/L — ABNORMAL LOW (ref 1.15–1.40)
GLUCOSE: 86 mg/dL (ref 65–99)
HEMATOCRIT: 42 % (ref 39.0–52.0)
Hemoglobin: 14.3 g/dL (ref 13.0–17.0)
POTASSIUM: 3.7 mmol/L (ref 3.5–5.1)
Sodium: 141 mmol/L (ref 135–145)
TCO2: 30 mmol/L (ref 0–100)

## 2016-06-10 LAB — POC OCCULT BLOOD, ED: Fecal Occult Bld: POSITIVE — AB

## 2016-06-10 LAB — I-STAT TROPONIN, ED: Troponin i, poc: 0.01 ng/mL (ref 0.00–0.08)

## 2016-06-10 MED ORDER — OMEPRAZOLE 20 MG PO CPDR: 20.0000 mg | DELAYED_RELEASE_CAPSULE | Freq: Every day | ORAL | 1 refills | Status: DC

## 2016-06-10 MED ORDER — SUCRALFATE 1 G PO TABS: 1.0000 g | ORAL_TABLET | Freq: Three times a day (TID) | ORAL | 0 refills | Status: DC

## 2016-06-10 NOTE — Discharge Instructions (Signed)
You need to be seen by a gastroenterologist for further evaluation. You may need endoscopy (scope) of both your upper and lower gastrointestinal tract. Call [place] [facility] to schedule appointment. You also need a family doctor. Use the resources provided to you. Take omeprazole as prescribed every day. Avoid alcohol, smoking and foods as listed in your discharge instructions.

## 2016-06-10 NOTE — ED Provider Notes (Signed)
[facility]-EMERGENCY DEPT Provider Note   CSN: [id number] Arrival date & time: 06/10/16  1410     History   Chief Complaint Chief Complaint  Patient presents with  . Abdominal Pain    HPI Duane Boone is a 35 y.o. male.  HPI Patient reports that he has been having rectal bleeding. He has had loose semi-diarrheal stool for about 4 days. He reports that yesterday he wiped after a bowel movement and saw some blood. Today he had blood on the stool. He shows me a picture of a large quantity of soft, semi-formed greenish stool with flecks in streaks of red blood adherent to it. Patient is also problems with long-standing epigastric discomfort. He reports he's had a history of gastritis. He reports pain is made worse by greasy or spicy foods. Patient also reports he does consume alcohol and smoke. He reports he has not taken any medications for his stomach in quite some time. He has had nausea but no vomiting. Epigastric pain which waxes and wanes and chronic in nature. No fevers or chills. Patient complained of chest pain while he was waiting for a room. This has not been a previously existing problem. No shortness of breath fever or cough.  Patient reports he has recently been tested and tested negative for HIV, syphilis, gonorrhea and chlamydia. Past Medical History:  Diagnosis Date  . Gastric ulcer     There are no active problems to display for this patient.   History reviewed. No pertinent surgical history.     Home Medications    Prior to Admission medications   Medication Sig Start Date End Date Taking? Authorizing Provider  ipratropium (ATROVENT) 0.03 % nasal spray Place 2 sprays into both nostrils every 12 (twelve) hours. Patient not taking: Reported on 06/10/2016 02/27/16   April Palumbo, MD  loratadine-pseudoephedrine (CLARITIN-D 12 HOUR) 5-120 MG tablet Take 1 tablet by mouth 2 (two) times daily. Patient not taking: Reported on 06/10/2016 02/27/16   April Palumbo, MD    metoCLOPramide (REGLAN) 10 MG tablet Take 1 tablet (10 mg total) by mouth every 6 (six) hours. Patient not taking: Reported on 06/10/2016 02/27/16   April Palumbo, MD  omeprazole (PRILOSEC) 20 MG capsule Take 1 capsule (20 mg total) by mouth daily. 06/10/16   Arby Barrette, MD  sucralfate (CARAFATE) 1 g tablet Take 1 tablet (1 g total) by mouth 4 (four) times daily -  with meals and at bedtime. 06/10/16   Arby Barrette, MD    Family History History reviewed. No pertinent family history.  Social History Social History  Substance Use Topics  . Smoking status: Current Every Day Smoker  . Smokeless tobacco: Never Used  . Alcohol use Yes     Allergies   Patient has no known allergies.   Review of Systems Review of Systems 10 Systems reviewed and are negative for acute change except as noted in the HPI.  Physical Exam Updated Vital Signs BP 114/68 (BP Location: Left Arm)   Pulse 81   Temp 98.4 F (36.9 C) (Oral)   Resp 19   SpO2 98%   Physical Exam  Constitutional: He is oriented to person, place, and time. He appears well-developed and well-nourished.  HENT:  Head: Normocephalic and atraumatic.  Mouth/Throat: Oropharynx is clear and moist.  Eyes: Conjunctivae and EOM are normal.  Neck: Neck supple.  Cardiovascular: Normal rate and regular rhythm.   No murmur heard. Pulmonary/Chest: Effort normal and breath sounds normal. No respiratory distress.  Abdominal:  Soft. He exhibits no distension. There is tenderness.  Mild epigastric pain to palpation without guarding.  Genitourinary:  Genitourinary Comments: Rectal normal. Trace stool, no visible blood or melena.  Musculoskeletal: He exhibits no edema, tenderness or deformity.  Neurological: He is alert and oriented to person, place, and time. No cranial nerve deficit. He exhibits normal muscle tone. Coordination normal.  Skin: Skin is warm and dry.  Psychiatric: He has a normal mood and affect.  Nursing note and vitals  reviewed.    ED Treatments / Results  Labs (all labs ordered are listed, but only abnormal results are displayed) Labs Reviewed  CBC WITH DIFFERENTIAL/PLATELET - Abnormal; Notable for the following:       Result Value   WBC 13.1 (*)    Neutro Abs 9.2 (*)    All other components within normal limits  URINALYSIS, ROUTINE W REFLEX MICROSCOPIC (NOT AT [facility]) - Abnormal; Notable for the following:    APPearance TURBID (*)    All other components within normal limits  I-STAT CHEM 8, ED - Abnormal; Notable for the following:    Chloride 100 (*)    BUN 4 (*)    Calcium, Ion 1.12 (*)    All other components within normal limits  POC OCCULT BLOOD, ED - Abnormal; Notable for the following:    Fecal Occult Bld POSITIVE (*)    All other components within normal limits  URINE MICROSCOPIC-ADD ON  I-STAT TROPOININ, ED  GC/CHLAMYDIA PROBE AMP ([place]) NOT AT [facility]    EKG  EKG Interpretation  Date/Time:  Thursday June 10 2016 14:58:39 EST Ventricular Rate:  80 PR Interval:    QRS Duration: 84 QT Interval:  359 QTC Calculation: 415 R Axis:   92 Text Interpretation:  Right and left arm electrode reversal, interpretation assumes no reversal Sinus rhythm Consider left atrial enlargement Borderline right axis deviation ST elev, probable normal early repol pattern agree. no change from old Confirmed by Donnald GarrePfeiffer, MD, Lebron ConnersMarcy [phone] on 06/10/2016 6:11:08 PM       Radiology No results found.  Procedures Procedures (including critical care time)  Medications Ordered in ED Medications - No data to display   Initial Impression / Assessment and Plan / ED Course  I have reviewed the triage vital signs and the nursing notes.  Pertinent labs & imaging results that were available during my care of the patient were reviewed by me and considered in my medical decision making (see chart for details).  Clinical Course      Final Clinical Impressions(s) / ED Diagnoses   Final  diagnoses:  Rectal bleeding  Gastritis, presence of bleeding unspecified, unspecified chronicity, unspecified gastritis type  Patient describes past history of gastritis. He has not taken any PPI or H2 blocker for quite some time. He also smokes and drinks alcohol likely exacerbating gastritis. His abdominal examination is nonsurgical. There is no evidence of significant blood loss by vital signs or labs. He has not had any hematemesis. The patient will be started on omeprazole and Carafate. He is aware of the need to follow up with GI for probable endoscopy. He also complains of lower GI bleeding. He did show me an image of his stool. With specks and streaks of blood on stool, this appears most likely to be internal hemorrhoidal bleeding. He is advised as well this is to be reviewed by GI to determine if lower endoscopy would also be indicated. He describes loose and diarrheal stool for several days. This may  resolve with resolution of symptoms. I do not suspect bacterial infection. The patient is clinically well in appearance and has nontender lower abdominal exam. New Prescriptions New Prescriptions   OMEPRAZOLE (PRILOSEC) 20 MG CAPSULE    Take 1 capsule (20 mg total) by mouth daily.   SUCRALFATE (CARAFATE) 1 G TABLET    Take 1 tablet (1 g total) by mouth 4 (four) times daily -  with meals and at bedtime.     Arby BarretteMarcy Miquela Costabile, MD 06/10/16 [phone]

## 2016-06-10 NOTE — ED Triage Notes (Signed)
Pt explained of delay to receive room d/t availability.  Pt called RN back 20 min later stating that he is having chest pain. EKG performed and cleared by MD.  Additional lab ordered to evaluate.

## 2016-06-10 NOTE — Progress Notes (Signed)
CM spoke with pt who [place].  CM discussed and provided written information to assist pt with determining choice for uninsured accepting pcps, discussed the importance of pcp vs EDP services for f/u care, www.needymeds.org, www.goodrx.com, discounted pharmacies and other [place] resources such as [facility] , [facility], affordable care act, financial assistance, uninsured dental services, [place] med assist, DSS and  health department  Reviewed resources for [place] uninsured accepting pcps like [facility], family medicine at [address], [facility], [facility], local urgent care centers, [facility], [facility] family practice, general medical clinics, family services of the [place], [facility] urgent care plus others, medication resources, [facility] out patient pharmacies and housing Pt voiced understanding and appreciation of resources provided   Provided [facility] contact information Pt did not agreed to a referral He wants to follow up with his mother to see if she has a recommendation

## 2016-06-10 NOTE — Progress Notes (Signed)
Went to see pt who is getting evaluated by EDP, Pfeiffer for guaiac stool

## 2016-06-10 NOTE — Progress Notes (Signed)
Entered in d/c instructions Please use the resources provided to you in emergency room by case manager to assist you're your choice of doctor for follow up  These [place] uninsured resources provide possible primary care providers, resources for discounted medications, housing, dental resources, affordable care act information, plus other resources for [place]   

## 2016-06-10 NOTE — ED Triage Notes (Addendum)
Pt states red to black stools today.  Started a few minutes ago.  Abdominal pain.  Nausea with no vomiting.  Diarrhea x 4 days.  Pt states dizziness also.  Seen yesterday but left prior to seeing medical provider d/t wait times.

## 2017-03-21 IMAGING — DX DG ABDOMEN ACUTE W/ 1V CHEST
3 series · 3 of 3 positions shown · non-contrast
Comparison: Chest radiographs 10/18/2008. CT Abdomen and Pelvis
11/17/2004.

CLINICAL DATA: 34-year-old male with lower abdominal pain for 2
days. Initial encounter.

EXAM:
DG ABDOMEN ACUTE W/ 1V CHEST

[w abdomen decub]
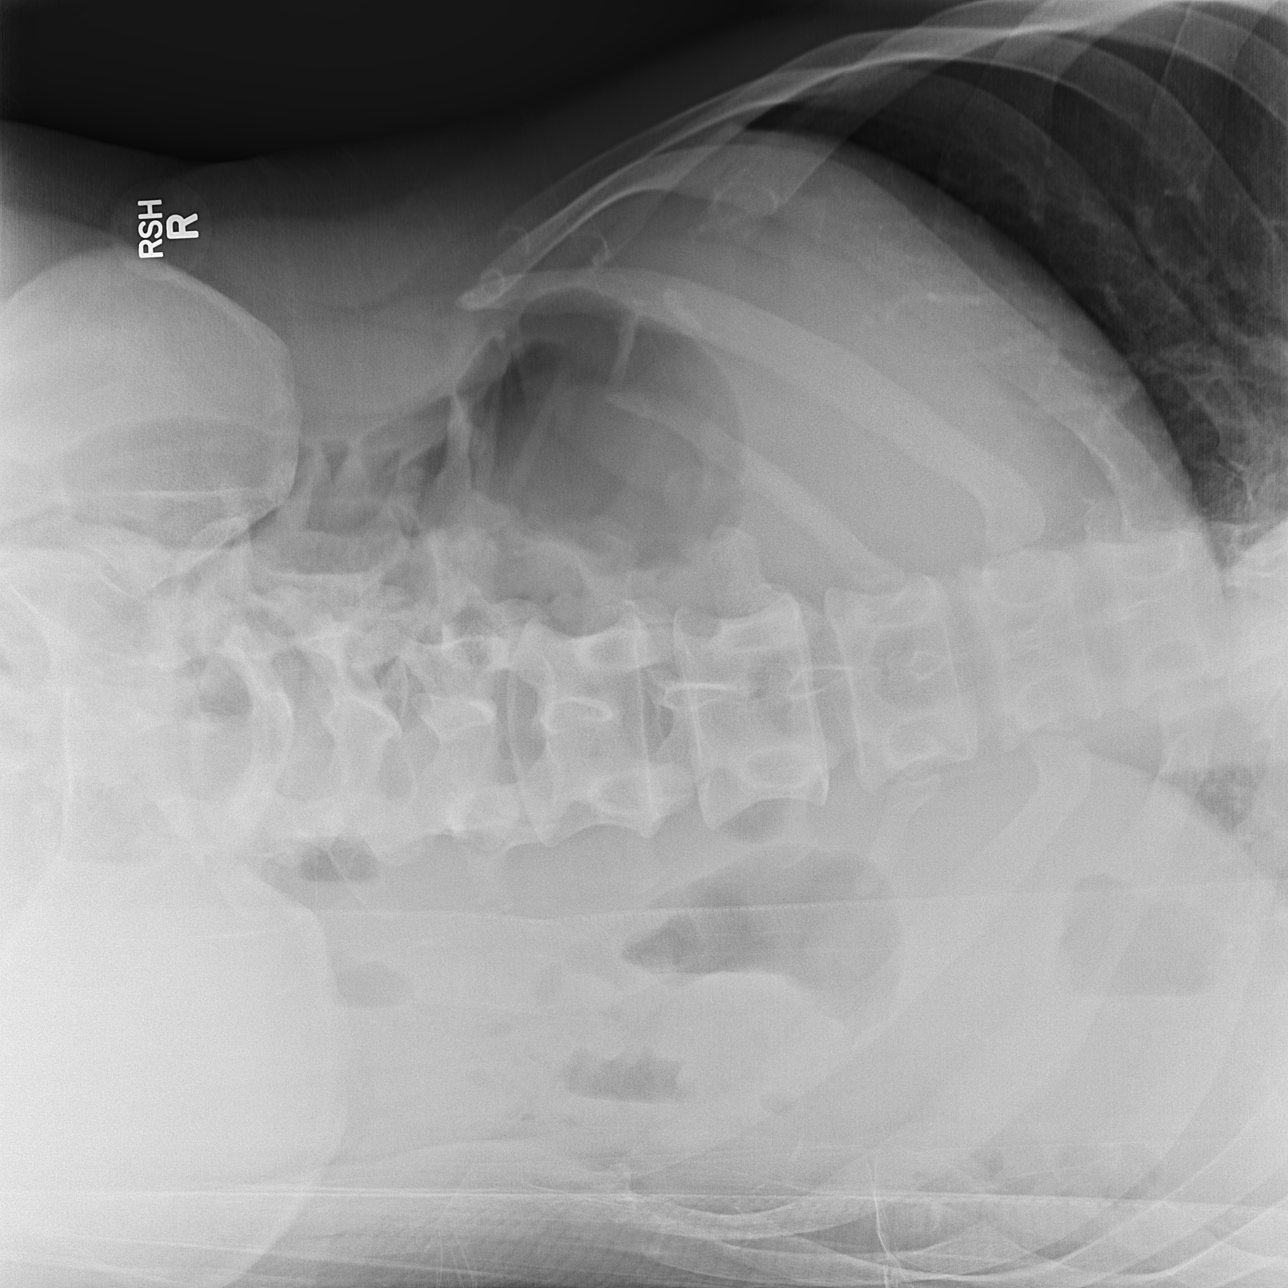

[x chest ap]
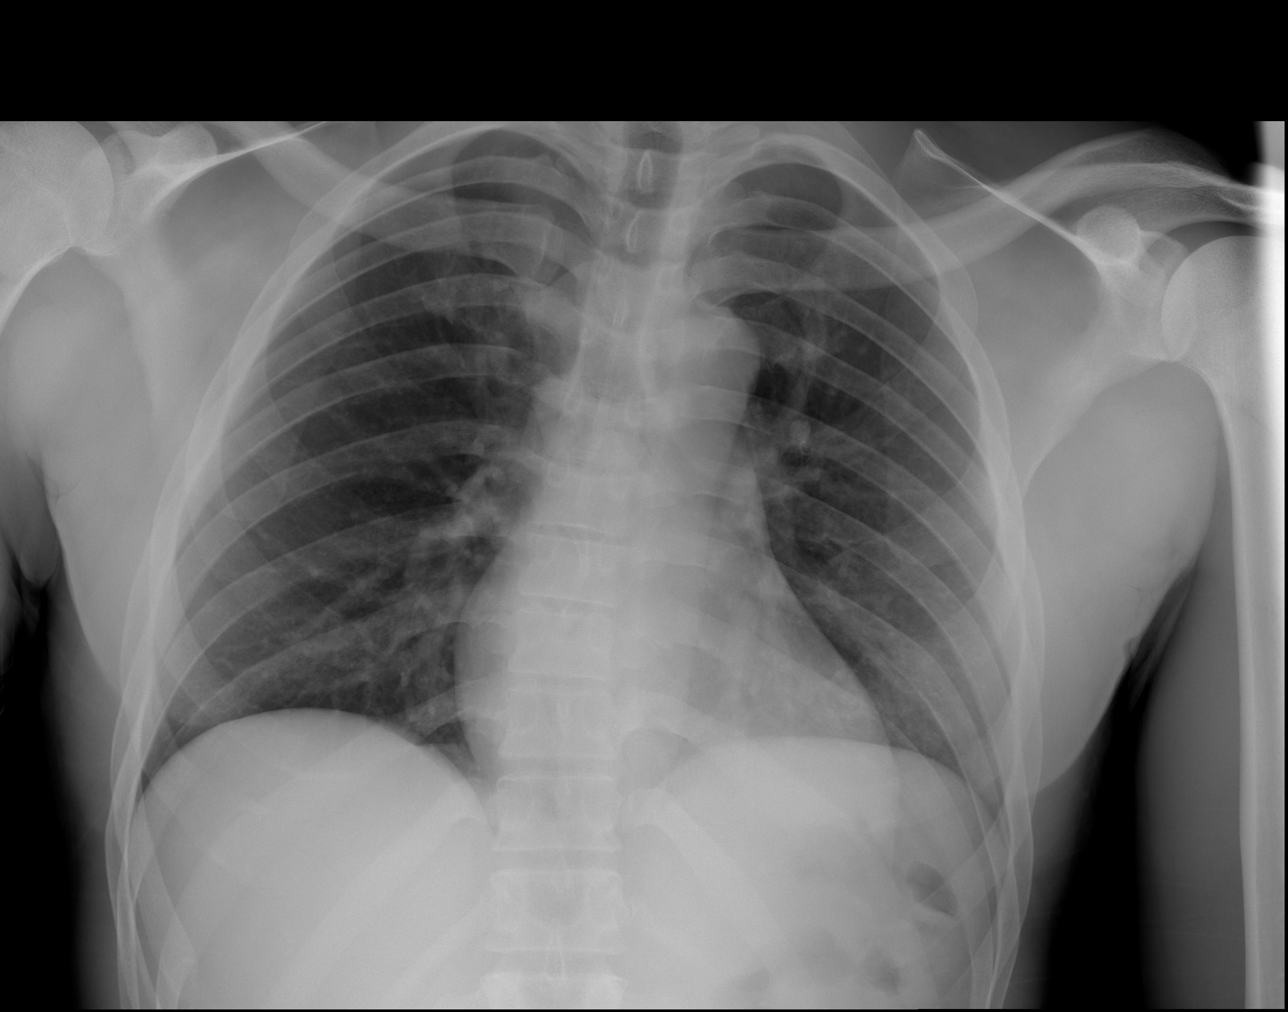

[t abdomen supine]
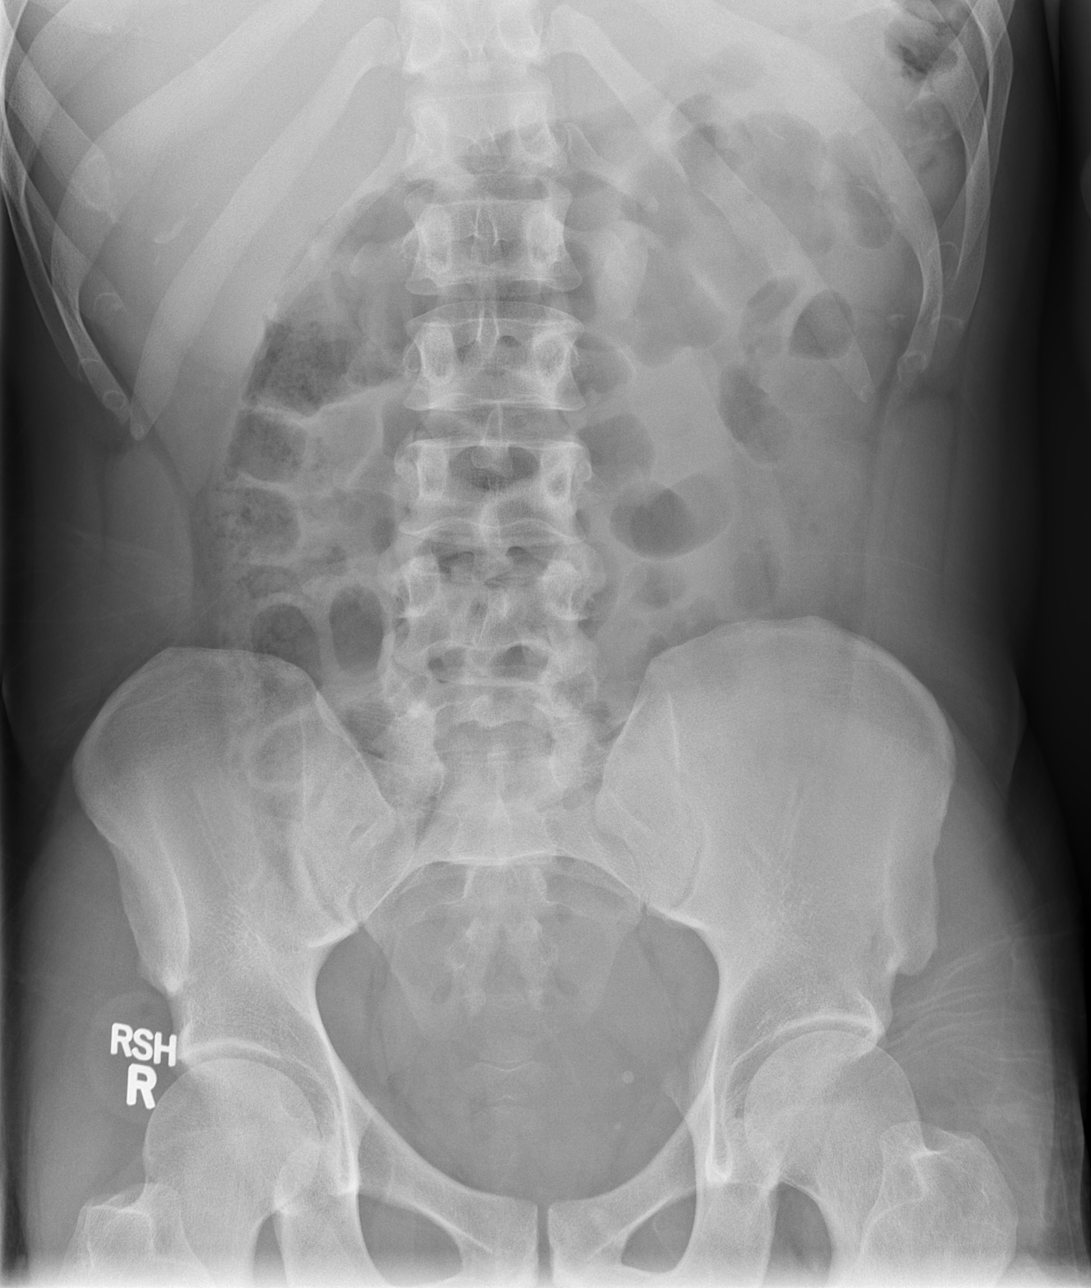

[3 of 3 positions shown; findings below may reference images not displayed]

FINDINGS: Seated upright view of the chest. Lower lung volumes, but the lungs
remain clear. Normal cardiac size and mediastinal contours. No
pneumothorax or pneumoperitoneum.

Non obstructed bowel gas pattern. No pneumoperitoneum. Small left
hemipelvis phleboliths re- demonstrated. Abdominal and pelvic
visceral contours appear normal. Mild thoracic scoliosis. No acute
osseous abnormality identified.
IMPRESSION: 1.  Normal bowel gas pattern, no free air.
2. Negative chest.

## 2019-03-20 ENCOUNTER — Emergency Department (HOSPITAL_COMMUNITY)
Admission: EM | Admit: 2019-03-20 | Discharge: 2019-03-20 | Disposition: A | Discharge status: Home / Self Care | Payer: Self-pay | Attending: Emergency Medicine | Admitting: Emergency Medicine

## 2019-03-20 ENCOUNTER — Encounter (HOSPITAL_COMMUNITY): Payer: Self-pay | Admitting: Emergency Medicine

## 2019-03-20 DIAGNOSIS — H5713 Ocular pain, bilateral: Secondary | ICD-10-CM | POA: Insufficient documentation

## 2019-03-20 DIAGNOSIS — F1210 Cannabis abuse, uncomplicated: Secondary | ICD-10-CM | POA: Insufficient documentation

## 2019-03-20 DIAGNOSIS — F17210 Nicotine dependence, cigarettes, uncomplicated: Secondary | ICD-10-CM | POA: Insufficient documentation

## 2019-03-20 MED ORDER — FLUORESCEIN SODIUM 1 MG OP STRP
1.0000 | ORAL_STRIP | Freq: Once | OPHTHALMIC | Status: AC
  Administered 2019-03-20: 16:00:00 1 via OPHTHALMIC
  Filled 2019-03-20: qty 1

## 2019-03-20 MED ORDER — OXYCODONE-ACETAMINOPHEN 5-325 MG PO TABS
1.0000 | ORAL_TABLET | Freq: Once | ORAL | Status: AC
  Administered 2019-03-20: 17:00:00 1 via ORAL
  Filled 2019-03-20: qty 1

## 2019-03-20 MED ORDER — ERYTHROMYCIN 5 MG/GM OP OINT
1.0000 "application " | TOPICAL_OINTMENT | Freq: Once | OPHTHALMIC | Status: AC
  Administered 2019-03-20: 1 via OPHTHALMIC
  Filled 2019-03-20: qty 3.5

## 2019-03-20 MED ORDER — OXYCODONE-ACETAMINOPHEN 5-325 MG PO TABS: 1.0000 | ORAL_TABLET | Freq: Three times a day (TID) | ORAL | 0 refills | Status: DC | PRN

## 2019-03-20 MED ORDER — TETRACAINE HCL 0.5 % OP SOLN
2.0000 [drp] | Freq: Once | OPHTHALMIC | Status: AC
Start: 2019-03-20 — End: 2019-03-20
  Administered 2019-03-20: 16:00:00 2 [drp] via OPHTHALMIC
  Filled 2019-03-20: qty 4

## 2019-03-20 NOTE — ED Provider Notes (Signed)
[facility] Provider Note   CSN: [id number] Arrival date & time: 03/20/19  1326     History   Chief Complaint Chief Complaint  Patient presents with  . Conjunctivitis    HPI Charlann Boxerorman J Viglione is a 38 y.o. male.     HPI  Pt is a 38 y/o male who presents to the ED today for eval of bilateral eye pain/irritation and drainage.  States his symptoms started in the right eye about 2 to 3 days ago.  Initially felt like he had an eyelash in his eye however it has since progressed and he is now having drainage that recurs every 15 to 30 minutes.  He does state that he has decreased vision to the bilateral eyes.  He is also complaining of photophobia.  Denies any fevers.  No sick contacts with similar symptoms.  No other associated complaints.  He does not wear contacts or glasses.  Past Medical History:  Diagnosis Date  . Gastric ulcer     There are no active problems to display for this patient.   History reviewed. No pertinent surgical history.     Home Medications    Prior to Admission medications   Medication Sig Start Date End Date Taking? Authorizing Provider  ipratropium (ATROVENT) 0.03 % nasal spray Place 2 sprays into both nostrils every 12 (twelve) hours. Patient not taking: Reported on 06/10/2016 02/27/16   Palumbo, April, MD  loratadine-pseudoephedrine (CLARITIN-D 12 HOUR) 5-120 MG tablet Take 1 tablet by mouth 2 (two) times daily. Patient not taking: Reported on 06/10/2016 02/27/16   Palumbo, April, MD  metoCLOPramide (REGLAN) 10 MG tablet Take 1 tablet (10 mg total) by mouth every 6 (six) hours. Patient not taking: Reported on 06/10/2016 02/27/16   Palumbo, April, MD  omeprazole (PRILOSEC) 20 MG capsule Take 1 capsule (20 mg total) by mouth daily. 06/10/16   Arby BarrettePfeiffer, Marcy, MD  oxyCODONE-acetaminophen (PERCOCET/ROXICET) 5-325 MG tablet Take 1 tablet by mouth every 8 (eight) hours as needed for severe pain. 03/20/19   Durwin Davisson  S, PA-C  sucralfate (CARAFATE) 1 g tablet Take 1 tablet (1 g total) by mouth 4 (four) times daily -  with meals and at bedtime. 06/10/16   Arby BarrettePfeiffer, Marcy, MD    Family History History reviewed. No pertinent family history.  Social History Social History   Tobacco Use  . Smoking status: Current Every Day Smoker  . Smokeless tobacco: Never Used  Substance Use Topics  . Alcohol use: Yes  . Drug use: Yes    Types: Marijuana     Allergies   Patient has no known allergies.   Review of Systems Review of Systems  Constitutional: Negative for fever.  HENT: Negative for ear pain and sore throat.   Eyes: Positive for photophobia, pain, discharge, redness, itching and visual disturbance.  Gastrointestinal: Negative for nausea and vomiting.  Genitourinary: Negative for dysuria.  Musculoskeletal: Negative for back pain.  Skin: Negative for rash.  Neurological: Negative for dizziness, weakness, light-headedness and numbness.  All other systems reviewed and are negative.    Physical Exam Updated Vital Signs BP 119/82 (BP Location: Right Arm)   Pulse 63   Temp 98.7 F (37.1 C) (Oral)   Resp 17   SpO2 97%   Physical Exam Constitutional:      General: He is not in acute distress.    Appearance: He is well-developed.  Eyes:     General:  Right eye: Discharge present.        Left eye: Discharge present.    Extraocular Movements: Extraocular movements intact.     Pupils: Pupils are equal, round, and reactive to light.     Comments: Conjunctive a injected bilaterally.  Everted the right upper lid and lower lid and did not see any evidence of foreign body.  Fluorescein stain completed and there was no evidence of uptake to the eyes bilaterally.  No pain with extraocular movements.  Tonometry completed: R: 23 mmHg, L: 15 mmHg. +photophobia bilaterally  Cardiovascular:     Rate and Rhythm: Normal rate.  Pulmonary:     Effort: Pulmonary effort is normal.  Skin:    General:  Skin is warm and dry.  Neurological:     Mental Status: He is alert and oriented to person, place, and time.     ED Treatments / Results  Labs (all labs ordered are listed, but only abnormal results are displayed) Labs Reviewed - No data to display  EKG None  Radiology No results found.  Procedures Procedures (including critical care time)  Medications Ordered in ED Medications  tetracaine (PONTOCAINE) 0.5 % ophthalmic solution 2 drop (2 drops Both Eyes Given 03/20/19 1557)  fluorescein ophthalmic strip 1 strip (1 strip Both Eyes Given 03/20/19 1557)  oxyCODONE-acetaminophen (PERCOCET/ROXICET) 5-325 MG per tablet 1 tablet (1 tablet Oral Given 03/20/19 1709)  erythromycin ophthalmic ointment 1 application (1 application Both Eyes Given 03/20/19 1736)     Initial Impression / Assessment and Plan / ED Course  I have reviewed the triage vital signs and the nursing notes.  Pertinent labs & imaging results that were available during my care of the patient were reviewed by me and considered in my medical decision making (see chart for details).    Final Clinical Impressions(s) / ED Diagnoses   Final diagnoses:  Eye pain, bilateral   38 year old male presenting with bilateral eye pain/irritation, purulent discharge, vision changes ongoing for the last several days.   On exam, Conjunctive are injected bilaterally.  Everted the right upper lid and lower lid and did not see any evidence of foreign body.  Fluorescein stain completed and there was no evidence of uptake to the eyes bilaterally.  No pain with extraocular movements.  Tonometry completed: R: 23 mmHg, L: 15 mmHg.  + photophobia bilaterally  Initial visual acuity was severely decreased.  Patient given pain medications, and remainder of eye exam completed.  Reassess visual acuity and patient continues to have decreased visual acuity bilaterally.  Due to concern for decreased visual acuity with photophobia, consult to ophtho  placed.   5:27 PM CONSULT with Dr. Kathlen Mody with ophthalmology who will see the patient in the office tomorrow at 8 AM.   Patient given erythromycin ointment in the ED.  Also given pain medications.  He was informed of his appointment with and advised to follow-up accordingly.  Advised on strict return precautions.  He voiced understanding and is agreement plan.  Questions answered.  Patient stable for discharge.  ED Discharge Orders         Ordered    oxyCODONE-acetaminophen (PERCOCET/ROXICET) 5-325 MG tablet  Every 8 hours PRN     03/20/19 1746           Rodney Booze, PA-C 03/20/19 1755    Tegeler, Gwenyth Allegra, MD 03/20/19 2042

## 2019-03-20 NOTE — ED Notes (Signed)
Vision test preformed on pt. Pt could not read top line completely form 10 feet, or 5 feet away.

## 2019-03-20 NOTE — Discharge Instructions (Signed)
You may use the erythromycin ointment every 6 hours for the next 7 days.    You were given an appointment with an ophthalmology doctor.  You will need to go to the appointment tomorrow at 8 AM for further evaluation of your eye.    If in the meantime your symptoms change or worsen then he should return to the emergency department immediately.

## 2019-03-20 NOTE — ED Triage Notes (Signed)
Pt here with what looks to be pinkeye in the right eye that started 2 days ago

## 2019-10-09 ENCOUNTER — Ambulatory Visit (HOSPITAL_COMMUNITY)
Admission: AD | Admit: 2019-10-09 | Discharge: 2019-10-09 | Disposition: A | Discharge status: Home / Self Care | Payer: Self-pay | Attending: Psychiatry | Admitting: Psychiatry

## 2019-10-09 DIAGNOSIS — F1910 Other psychoactive substance abuse, uncomplicated: Secondary | ICD-10-CM | POA: Insufficient documentation

## 2019-10-09 DIAGNOSIS — F419 Anxiety disorder, unspecified: Secondary | ICD-10-CM | POA: Insufficient documentation

## 2019-10-09 NOTE — H&P (Signed)
Behavioral Health Medical Screening Exam  Duane Boone is an 39 y.o. male patient presents as walk in requesting rehab service and outpatient psychiatric service resources.  Patient denies suicidal/self-harm/homicidal ideation, psychosis, and paranoia.    During evaluation Duane Boone is sitting up right; he is alert/oriented x 4; calm/cooperative; and mood congruent with affect.  Patient is speaking in a clear tone at moderate volume, and normal pace; with good eye contact.  His thought process is coherent and relevant; There is no indication that he is currently responding to internal/external stimuli or experiencing delusional thought content.  Patient denies suicidal/self-harm/homicidal ideation, psychosis, and paranoia.  Patient has remained calm throughout assessment and has answered questions appropriately.    Total Time spent with patient: 30 minutes  Psychiatric Specialty Exam: Physical Exam  Vitals reviewed. Constitutional: He is oriented to person, place, and time. He appears well-developed and well-nourished. No distress.  Respiratory: Effort normal.  Musculoskeletal:        General: Normal range of motion.     Cervical back: Normal range of motion.  Neurological: He is alert and oriented to person, place, and time.  Skin: Skin is warm and dry.  Psychiatric: He has a normal mood and affect. His speech is normal and behavior is normal. Judgment and thought content normal. He is not withdrawn. Thought content is not paranoid and not delusional. Cognition and memory are normal. He expresses no homicidal and no suicidal ideation.    Review of Systems  Psychiatric/Behavioral: Negative for agitation, behavioral problems, confusion, hallucinations, self-injury, sleep disturbance and suicidal ideas. The patient is not nervous/anxious.        Requesting services for drug rehab.  Not interested in long term only short term and outpatient psychiatric resources   All other systems  reviewed and are negative.   Blood pressure 115/85, pulse 96, temperature 98.5 F (36.9 C), resp. rate 18, SpO2 98 %.There is no height or weight on file to calculate BMI.  General Appearance: Casual  Eye Contact:  Good  Speech:  Clear and Coherent and Normal Rate  Volume:  Normal  Mood:  "Good"   Affect:  Appropriate and Congruent  Thought Process:  Coherent, Goal Directed and Descriptions of Associations: Intact  Orientation:  Full (Time, Place, and Person)  Thought Content:  Logical  Suicidal Thoughts:  No  Homicidal Thoughts:  No  Memory:  Immediate;   Good Recent;   Good  Judgement:  Intact  Insight:  Present  Psychomotor Activity:  Normal  Concentration: Concentration: Good and Attention Span: Good  Recall:  Good  Fund of Knowledge:Fair  Language: Good  Akathisia:  No  Handed:  Right  AIMS (if indicated):     Assets:  Communication Skills Desire for Improvement Housing Social Support  Sleep:       Musculoskeletal: Strength & Muscle Tone: within normal limits Gait & Station: normal Patient leans: N/A  Blood pressure 115/85, pulse 96, temperature 98.5 F (36.9 C), resp. rate 18, SpO2 98 %.  Recommendations:  Resources for community services for substance abuse and outpatient psychiatric services  Based on my evaluation the patient does not appear to have an emergency medical condition.  Maziyah Vessel, NP 10/09/2019, 7:00 PM

## 2019-10-09 NOTE — BH Assessment (Signed)
Assessment Note  Duane Boone is an 39 y.o. male with history of depression and Schizophrenia. He presented to [facility], voluntarily. He was brought to [facility] by his mother via car. States that he is looking for detox from cocaine and THC. Patient started using both substance at the age of 39 yrs old. He reports daily use of both substance. He smokes THC "every hour on the hour". He typically uses cocaine throughout the day starting at 7am in the morning. Patient's last use of both substances was today. He used THC 1hr prior to his arrival. He also used cocaine this morning. Patient reports feeling stressed about his continued drug use and inability to stop. He is trying to maintain employment  but states he hasn't been able to work that much due to his substance use. He denies SI. No history of SI or self mutilating behaviors. No HI. No history of aggressive behaviors. He denies AVH's. However, states that he has a history of hearing voices. He does not have an outpatient therapist or psychiatrist. States that most of mental health medication management was handled in jail. He spent 23 yrs in jail and was given Lithium and Abilify during his entire time in prison. States that even though he was in jail those 23 yrs it didn't stop his drug use. Patient has no history of inpatient treatment.  Patient was oriented to person, time, place, and situation. Speech was normal. Affect was appropriate. Mood was depressed. He was dressed appropriately.    Diagnosis: Major Depressive Disorder, Recurrent, Severe; Depressive Disorder; Substance Use Disorder  Past Medical History:  Past Medical History:  Diagnosis Date  . Gastric ulcer     No past surgical history on file.  Family History: No family history on file.  Social History:  reports that he has been smoking. He has never used smokeless tobacco. He reports current alcohol use. He reports current drug use. Drug: Marijuana.  Additional Social History:   Alcohol / Drug Use Pain Medications: SEE MAR Prescriptions: SEE MAR Over the Counter: SEE MAR History of alcohol / drug use?: Yes Longest period of sobriety (when/how long): n/a Negative Consequences of Use: Legal, Personal relationships, Work / School Substance #1 Name of Substance 1: THC 1 - Age of First Use: 9 1 - Amount (size/oz): "Every hour on the hour" 1 - Frequency: daily 1 - Duration: daily since the age of 38 1 - Last Use / Amount: 1 hr prior to arrival Substance #2 Name of Substance 2: Cocaine 2 - Age of First Use: 39 yrs old 2 - Amount (size/oz): 1/2 ounce every 3 days 2 - Frequency: daily 2 - Duration: on-going 2 - Last Use / Amount: "This morning at 7am"  CIWA: CIWA-Ar BP: 115/85 Pulse Rate: 96 COWS:    Allergies: No Known Allergies  Home Medications: (Not in a hospital admission)   OB/GYN Status:  No LMP for male patient.  General Assessment Data TTS Assessment: In system Is this a Tele or Face-to-Face Assessment?: Face-to-Face Is this an Initial Assessment or a Re-assessment for this encounter?: Initial Assessment Patient Accompanied by:: (walk in ) Language Other than English: No Living Arrangements: Other (Comment)(with mother ) What gender do you identify as?: Male Marital status: Single Maiden name: (n/a) Pregnancy Status: No Living Arrangements: Parent Can pt return to current living arrangement?: Yes Admission Status: Voluntary Is patient capable of signing voluntary admission?: Yes Referral Source: Self/Family/Friend Insurance type: (Self Pay )  Medical Screening Exam [facility] Walk-in  ONLY) Medical Exam completed: Yes(Shuvon Rankin, NP)  Crisis Care Plan Living Arrangements: Parent Legal Guardian: Other:(no legal guardian ) Name of Psychiatrist: (no psychiatrist ) Name of Therapist: (no therapist )  Education Status Is patient currently in school?: No Is the patient employed, unemployed or receiving disability?: Unemployed  Risk to  self with the past 6 months Suicidal Ideation: No Has patient been a risk to self within the past 6 months prior to admission? : No Suicidal Intent: No Has patient had any suicidal intent within the past 6 months prior to admission? : No Is patient at risk for suicide?: No Suicidal Plan?: No Has patient had any suicidal plan within the past 6 months prior to admission? : No Access to Means: No What has been your use of drugs/alcohol within the last 12 months?: (cocaine and thc ) Previous Attempts/Gestures: No How many times?: (0) Other Self Harm Risks: (n/a) Triggers for Past Attempts: (no past attempt ) Intentional Self Injurious Behavior: None Family Suicide History: No Recent stressful life event(s): Other (Comment)("I need to get off drugs") Persecutory voices/beliefs?: No Depression: Yes Depression Symptoms: Feeling angry/irritable, Feeling worthless/self pity, Loss of interest in usual pleasures, Guilt, Fatigue, Isolating, Tearfulness Substance abuse history and/or treatment for substance abuse?: No Suicide prevention information given to non-admitted patients: Not applicable  Risk to Others within the past 6 months Homicidal Ideation: No Does patient have any lifetime risk of violence toward others beyond the six months prior to admission? : No Thoughts of Harm to Others: No Current Homicidal Intent: No Current Homicidal Plan: No Access to Homicidal Means: No Identified Victim: (n/a) History of harm to others?: No Assessment of Violence: None Noted Violent Behavior Description: (patient calm and cooperative ) Does patient have access to weapons?: No Criminal Charges Pending?: No Does patient have a court date: No Is patient on probation?: No  Psychosis Hallucinations: None noted("I use to hear voices but I don't anymore") Delusions: None noted  Mental Status Report Appearance/Hygiene: Unremarkable Eye Contact: Good Motor Activity: Freedom of movement Speech:  Logical/coherent Level of Consciousness: Alert Mood: Depressed Affect: Appropriate to circumstance Anxiety Level: Severe Thought Processes: Relevant Judgement: Unimpaired Orientation: Person, Place, Time, Situation Obsessive Compulsive Thoughts/Behaviors: None  Cognitive Functioning Concentration: Normal Memory: Remote Intact, Recent Intact Is patient IDD: No Insight: Poor Impulse Control: Fair Appetite: Fair Have you had any weight changes? : No Change Sleep: Decreased Total Hours of Sleep: ("When I use cocaine I'm up for three days") Vegetative Symptoms: None  ADLScreening [facility] Assessment Services) Patient's cognitive ability adequate to safely complete daily activities?: Yes Patient able to express need for assistance with ADLs?: Yes Independently performs ADLs?: Yes (appropriate for developmental age)  Prior Inpatient Therapy Prior Inpatient Therapy: No  Prior Outpatient Therapy Prior Outpatient Therapy: No Does patient have an ACCT team?: No Does patient have Intensive In-House Services?  : No Does patient have [facility] services? : No Does patient have P4CC services?: No  ADL Screening (condition at time of admission) Patient's cognitive ability adequate to safely complete daily activities?: Yes Is the patient deaf or have difficulty hearing?: No Does the patient have difficulty seeing, even when wearing glasses/contacts?: Yes Does the patient have difficulty concentrating, remembering, or making decisions?: No Patient able to express need for assistance with ADLs?: Yes Does the patient have difficulty dressing or bathing?: No Independently performs ADLs?: Yes (appropriate for developmental age) Does the patient have difficulty walking or climbing stairs?: No Weakness of Legs: None Weakness of Arms/Hands: None  Home Assistive Devices/Equipment Home Assistive Devices/Equipment: None  Therapy Consults (therapy consults require a physician order) PT Evaluation  Needed: No OT Evalulation Needed: No SLP Evaluation Needed: No       Advance Directives (For Healthcare) Does Patient Have a Medical Advance Directive?: No          Disposition: Patient does not meet criteria for inpatient treatment, per Assunta Found, NP. Patient referred to [facility], [facility], and [facility]. Disposition Initial Assessment Completed for this Encounter: Yes Patient referred to: [facility], [facility], Outpatient clinic referral([facility]. [facility]. [facility])  On Site Evaluation by:   Reviewed with Physician:    Melynda Ripple 10/09/2019 6:58 PM

## 2020-03-08 ENCOUNTER — Encounter (HOSPITAL_COMMUNITY): Payer: Self-pay | Admitting: Emergency Medicine

## 2020-03-08 ENCOUNTER — Emergency Department (HOSPITAL_COMMUNITY)
Admission: EM | Admit: 2020-03-08 | Discharge: 2020-03-08 | Disposition: A | Discharge status: Home / Self Care | Payer: Self-pay | Attending: Emergency Medicine | Admitting: Emergency Medicine

## 2020-03-08 ENCOUNTER — Other Ambulatory Visit: Payer: Self-pay

## 2020-03-08 ENCOUNTER — Emergency Department (HOSPITAL_COMMUNITY): Payer: Self-pay

## 2020-03-08 DIAGNOSIS — S51011A Laceration without foreign body of right elbow, initial encounter: Secondary | ICD-10-CM

## 2020-03-08 DIAGNOSIS — Y9389 Activity, other specified: Secondary | ICD-10-CM | POA: Insufficient documentation

## 2020-03-08 DIAGNOSIS — W278XXA Contact with other nonpowered hand tool, initial encounter: Secondary | ICD-10-CM | POA: Insufficient documentation

## 2020-03-08 DIAGNOSIS — Z23 Encounter for immunization: Secondary | ICD-10-CM | POA: Insufficient documentation

## 2020-03-08 DIAGNOSIS — F17200 Nicotine dependence, unspecified, uncomplicated: Secondary | ICD-10-CM | POA: Insufficient documentation

## 2020-03-08 DIAGNOSIS — F1210 Cannabis abuse, uncomplicated: Secondary | ICD-10-CM | POA: Insufficient documentation

## 2020-03-08 DIAGNOSIS — Z79899 Other long term (current) drug therapy: Secondary | ICD-10-CM | POA: Insufficient documentation

## 2020-03-08 DIAGNOSIS — Y998 Other external cause status: Secondary | ICD-10-CM | POA: Insufficient documentation

## 2020-03-08 DIAGNOSIS — Y929 Unspecified place or not applicable: Secondary | ICD-10-CM | POA: Insufficient documentation

## 2020-03-08 MED ORDER — PANTOPRAZOLE SODIUM 20 MG PO TBEC: 20.0000 mg | DELAYED_RELEASE_TABLET | Freq: Every day | ORAL | 1 refills | Status: DC

## 2020-03-08 MED ORDER — TETANUS-DIPHTH-ACELL PERTUSSIS 5-2.5-18.5 LF-MCG/0.5 IM SUSP
0.5000 mL | Freq: Once | INTRAMUSCULAR | Status: AC
  Administered 2020-03-08: 0.5 mL via INTRAMUSCULAR
  Filled 2020-03-08: qty 0.5
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
## 2020-03-08 NOTE — Discharge Instructions (Signed)
  Wound Care - Laceration You may remove the bandage after 24 hours. Clean the wound and surrounding area gently with tap water and mild soap. Rinse well and blot dry. Do not scrub the wound, as this may cause the wound edges to come apart. You may shower, but avoid submerging the wound, such as with a bath or swimming. Clean the wound daily to prevent infection. Do not use cleaners such as hydrogen peroxide or alcohol.   Scar reduction: Application of a topical antibiotic ointment, such as Neosporin, after the wound has begun to close and heal well can decrease scab formation and reduce scarring. After the wound has healed and wound closures have been removed, application of ointments such as Aquaphor can also reduce scar formation.  The key to scar reduction is keeping the skin well hydrated and supple. Drinking plenty of water throughout the day (At least eight 8oz glasses of water a day) is essential to staying well hydrated.  Sun exposure: Keep the wound out of the sun. After the wound has healed, continue to protect it from the sun by wearing protective clothing or applying sunscreen.  Pain: You may use Tylenol, naproxen, or ibuprofen for pain.  Clip removal: Return to the ED in 10-12 days for clip removal or you may remove them yourself if you prefer.  Return to the ED sooner should the wound edges come apart or signs of infection arise, such as spreading redness, puffiness/swelling, pus draining from the wound, severe increase in pain, fever over 100.80F, or any other major issues.  For prescription assistance, may try using prescription discount sites or apps, such as goodrx.com  For rectal bleeding and/or concern for stomach ulcer, please adhere to the advice below, but especially follow-up in the office. Diet: Start with a clear liquid diet, progressed to a full liquid diet, and then bland solids as you are able. Please adhere to the enclosed dietary suggestions.  In general, avoid  NSAIDs (i.e. ibuprofen, naproxen, etc.), caffeine, alcohol, spicy foods, fatty foods, or any other foods that seem to cause your symptoms to arise.  Protonix: Take this medication daily, 20-30 minutes prior to your first meal, for the next 8 weeks.  Continue to take this medication even if you begin to feel better.  Follow-up: Please follow-up with gastroenterology on this matter.  Call to make an appointment.  Return: Return to the ED for significantly worsening symptoms, persistent vomiting, persistent fever, vomiting blood, blood in the stools, dark stools, or any other major concerns.  For prescription assistance, may try using prescription discount sites or apps, such as goodrx.com

## 2020-04-24 ENCOUNTER — Emergency Department (HOSPITAL_COMMUNITY)
Admission: EM | Admit: 2020-04-24 | Discharge: 2020-04-24 | Disposition: A | Discharge status: Home / Self Care | Payer: Self-pay | Attending: Emergency Medicine | Admitting: Emergency Medicine

## 2020-04-24 ENCOUNTER — Encounter (HOSPITAL_COMMUNITY): Payer: Self-pay | Admitting: Emergency Medicine

## 2020-04-24 DIAGNOSIS — Z20822 Contact with and (suspected) exposure to covid-19: Secondary | ICD-10-CM | POA: Insufficient documentation

## 2020-04-24 DIAGNOSIS — B349 Viral infection, unspecified: Secondary | ICD-10-CM | POA: Insufficient documentation

## 2020-04-24 DIAGNOSIS — F17200 Nicotine dependence, unspecified, uncomplicated: Secondary | ICD-10-CM | POA: Insufficient documentation

## 2020-04-24 DIAGNOSIS — Z202 Contact with and (suspected) exposure to infections with a predominantly sexual mode of transmission: Secondary | ICD-10-CM | POA: Insufficient documentation

## 2020-04-24 DIAGNOSIS — Z711 Person with feared health complaint in whom no diagnosis is made: Secondary | ICD-10-CM

## 2020-04-24 LAB — RAPID URINE DRUG SCREEN, HOSP PERFORMED
Amphetamines: NOT DETECTED
Barbiturates: NOT DETECTED
Benzodiazepines: NOT DETECTED
Cocaine: POSITIVE — AB
Opiates: NOT DETECTED
Tetrahydrocannabinol: POSITIVE — AB

## 2020-04-24 LAB — URINALYSIS, ROUTINE W REFLEX MICROSCOPIC
Bacteria, UA: NONE SEEN
Bilirubin Urine: NEGATIVE
Glucose, UA: NEGATIVE mg/dL
Ketones, ur: 5 mg/dL — AB
Leukocytes,Ua: NEGATIVE
Nitrite: NEGATIVE
Protein, ur: NEGATIVE mg/dL
Specific Gravity, Urine: 1.005 (ref 1.005–1.030)
pH: 6 (ref 5.0–8.0)

## 2020-04-24 LAB — RESPIRATORY PANEL BY RT PCR (FLU A&B, COVID)
Influenza A by PCR: NEGATIVE
Influenza B by PCR: NEGATIVE
SARS Coronavirus 2 by RT PCR: NEGATIVE

## 2020-04-24 LAB — ETHANOL: Alcohol, Ethyl (B): <10 mg/dL (ref <10)

## 2020-04-24 LAB — HIV ANTIBODY (ROUTINE TESTING W REFLEX): HIV Screen 4th Generation wRfx: NONREACTIVE

## 2020-04-24 MED ORDER — SODIUM CHLORIDE 0.9 % IV BOLUS
1000.0000 mL | Freq: Once | INTRAVENOUS | Status: AC
  Administered 2020-04-24: 1000 mL via INTRAVENOUS

## 2020-04-24 MED ORDER — LIDOCAINE HCL (PF) 1 % IJ SOLN
INTRAMUSCULAR | Status: AC
  Administered 2020-04-24: 1 mL
  Filled 2020-04-24: qty 5

## 2020-04-24 MED ORDER — PANTOPRAZOLE SODIUM 20 MG PO TBEC: 20.0000 mg | DELAYED_RELEASE_TABLET | Freq: Every day | ORAL | 0 refills | Status: AC

## 2020-04-24 MED ORDER — CEFTRIAXONE SODIUM 500 MG IJ SOLR
500.0000 mg | Freq: Once | INTRAMUSCULAR | Status: AC
  Administered 2020-04-24: 500 mg via INTRAMUSCULAR
  Filled 2020-04-24: qty 500

## 2020-04-24 NOTE — Discharge Instructions (Signed)
Please avoid heavy alcohol use and drug use as it can severely affect your health.  Also make sure to practice safe sex to avoid potential sexually transmitted infection.  Fortunately your Covid test is negative today.  Return to the ER if you have any concern.

## 2020-04-24 NOTE — ED Provider Notes (Signed)
[facility] EMERGENCY DEPARTMENT Provider Note   CSN: [id number] Arrival date & time: 04/24/20  1043     History Chief Complaint  Patient presents with  . Exposure to STD  . Chills    Duane Boone is a 39 y.o. male.  The history is provided by the patient. No language interpreter was used.  Exposure to STD     39 year old male presenting to the ED with multiple complaints.  Patient admits that he had with friends last night and drank a moderate amount of alcohol including an entire bottle of tequila.  Furthermore, he admits to having a 1 night stand with someone he did not know.  He did not use protection.  He woke up this morning with white powder on his nose.  States that he usually use marijuana but does not use other drugs.  He is concerned that he may have been exposed to other substance.  This morning, patient reports having runny nose, congestion, teary eyes, decrease in taste and smell, overall not feeling well.  Also endorsed having headache and having cold sweats.  He admits he has not been vaccinated for COVID-19.  Would like to be tested for COVID-19.  He also would like to be tested for STI.  Denies penile discharge or dysuria or rash.  Past Medical History:  Diagnosis Date  . Gastric ulcer     There are no problems to display for this patient.   History reviewed. No pertinent surgical history.     No family history on file.  Social History   Tobacco Use  . Smoking status: Current Every Day Smoker  . Smokeless tobacco: Never Used  Substance Use Topics  . Alcohol use: Yes  . Drug use: Yes    Types: Marijuana    Home Medications Prior to Admission medications   Medication Sig Start Date End Date Taking? Authorizing Provider  ipratropium (ATROVENT) 0.03 % nasal spray Place 2 sprays into both nostrils every 12 (twelve) hours. Patient not taking: Reported on 06/10/2016 02/27/16   Palumbo, April, MD  loratadine-pseudoephedrine  (CLARITIN-D 12 HOUR) 5-120 MG tablet Take 1 tablet by mouth 2 (two) times daily. Patient not taking: Reported on 06/10/2016 02/27/16   Palumbo, April, MD  metoCLOPramide (REGLAN) 10 MG tablet Take 1 tablet (10 mg total) by mouth every 6 (six) hours. Patient not taking: Reported on 06/10/2016 02/27/16   Palumbo, April, MD  oxyCODONE-acetaminophen (PERCOCET/ROXICET) 5-325 MG tablet Take 1 tablet by mouth every 8 (eight) hours as needed for severe pain. 03/20/19   Couture, Cortni S, PA-C  pantoprazole (PROTONIX) 20 MG tablet Take 1 tablet (20 mg total) by mouth daily. 03/08/20 05/07/20  Joy, Shawn C, PA-C  sucralfate (CARAFATE) 1 g tablet Take 1 tablet (1 g total) by mouth 4 (four) times daily -  with meals and at bedtime. 06/10/16   Arby Barrette, MD  omeprazole (PRILOSEC) 20 MG capsule Take 1 capsule (20 mg total) by mouth daily. 06/10/16 03/08/20  Arby Barrette, MD    Allergies    Patient has no known allergies.  Review of Systems   Review of Systems  All other systems reviewed and are negative.   Physical Exam Updated Vital Signs BP 119/87   Pulse 98   Temp 98.3 F (36.8 C) (Oral)   Resp 16   Ht 5\' 8"  (1.727 m)   Wt 65.8 kg   SpO2 100%   BMI 22.05 kg/m   Physical Exam Vitals and nursing  note reviewed.  Constitutional:      General: He is not in acute distress.    Appearance: He is well-developed.  HENT:     Head: Atraumatic.     Nose: Nose normal.     Mouth/Throat:     Mouth: Mucous membranes are moist.  Eyes:     Extraocular Movements: Extraocular movements intact.     Conjunctiva/sclera: Conjunctivae normal.     Pupils: Pupils are equal, round, and reactive to light.  Cardiovascular:     Rate and Rhythm: Normal rate and regular rhythm.     Pulses: Normal pulses.     Heart sounds: Normal heart sounds.  Pulmonary:     Effort: Pulmonary effort is normal.     Breath sounds: Normal breath sounds. No wheezing, rhonchi or rales.  Abdominal:     Palpations: Abdomen is  soft.     Tenderness: There is abdominal tenderness (Mild epigastric tenderness without guarding or rebound tenderness.).  Musculoskeletal:     Cervical back: Normal range of motion and neck supple. No rigidity.  Skin:    Findings: No rash.  Neurological:     Mental Status: He is alert and oriented to person, place, and time.  Psychiatric:        Mood and Affect: Mood normal.     ED Results / Procedures / Treatments   Labs (all labs ordered are listed, but only abnormal results are displayed) Labs Reviewed  RPR  URINALYSIS, ROUTINE W REFLEX MICROSCOPIC  HIV ANTIBODY (ROUTINE TESTING W REFLEX)  RAPID URINE DRUG SCREEN, HOSP PERFORMED  ETHANOL  GC/CHLAMYDIA PROBE AMP ([place]) NOT AT [facility]    EKG None  Radiology No results found.  Procedures Procedures (including critical care time)  Medications Ordered in ED Medications  sodium chloride 0.9 % bolus 1,000 mL (has no administration in time range)    ED Course  I have reviewed the triage vital signs and the nursing notes.  Pertinent labs & imaging results that were available during my care of the patient were reviewed by me and considered in my medical decision making (see chart for details).    MDM Rules/Calculators/A&P                          BP 119/85   Pulse 74   Temp 98.3 F (36.8 C) (Oral)   Resp 16   Ht 5\' 8"  (1.727 m)   Wt 65.8 kg   SpO2 99%   BMI 22.05 kg/m   Final Clinical Impression(s) / ED Diagnoses Final diagnoses:  Concern about STD in male without diagnosis  Viral illness    Rx / DC Orders ED Discharge Orders    None     11:52 AM Patient with multiple complaints.  First he reports concern for potentially sexually transmitted infection exposure after having a one night stand, not using protection.  STI screening obtained, patient given Rocephin to cover for potential infection per request.  UA positive for moderate hemoglobin and urine dipsticks but no significant signs of  infection.  Patient made aware.  UDS positive for cocaine and tetrahydrocannabinol.  HIV test is negative.  Covid test also negative.  Patient was given IV fluid.  At this time he is stable for discharge.  Encourage patient to perform safe sex.  Recommend avoid polysubstance use.  Return precaution given.  Patient also reports concern for potential Covid infection as he has consultations of viral symptoms.  Fortunately  Covid test is negative.  Encourage patient get vaccinated to decrease risk of infection.  Duane Boone was evaluated in Emergency Department on 04/24/2020 for the symptoms described in the history of present illness. He was evaluated in the context of the global COVID-19 pandemic, which necessitated consideration that the patient might be at risk for infection with the SARS-CoV-2 virus that causes COVID-19. Institutional protocols and algorithms that pertain to the evaluation of patients at risk for COVID-19 are in a state of rapid change based on information released by regulatory bodies including the CDC and federal and state organizations. These policies and algorithms were followed during the patient's care in the ED.     Fayrene Helper, PA-C 04/24/20 1536    Milagros Loll, MD 04/24/20 1540

## 2020-04-24 NOTE — ED Triage Notes (Signed)
Pt arrives to ED with c/o cold sweats and headache.  He went out with a group of friends last night and drank a large amount of alcohol , he chugged an entire bottle of 1800 tequila. Pt states he woke up in a car in front of a house he didn't know or recall any events. Pt states he also had a dried white substance on his nose but does not normally do drugs other than marijuana so is concerned if he was given something. He believes he had unprotected sex and would like to be tested.

## 2020-04-25 LAB — GC/CHLAMYDIA PROBE AMP (~~LOC~~) NOT AT ARMC
Chlamydia: NEGATIVE
Comment: NEGATIVE
Comment: NORMAL
Neisseria Gonorrhea: NEGATIVE

## 2020-04-25 LAB — RPR: RPR Ser Ql: NONREACTIVE

## 2021-11-03 ENCOUNTER — Other Ambulatory Visit: Payer: Self-pay

## 2021-11-03 ENCOUNTER — Emergency Department (HOSPITAL_COMMUNITY): Payer: 59

## 2021-11-03 ENCOUNTER — Emergency Department (HOSPITAL_COMMUNITY)
Admission: EM | Admit: 2021-11-03 | Discharge: 2021-11-04 | Disposition: A | Discharge status: Home / Self Care | Payer: 59 | Attending: Emergency Medicine | Admitting: Emergency Medicine

## 2021-11-03 DIAGNOSIS — K920 Hematemesis: Secondary | ICD-10-CM | POA: Diagnosis not present

## 2021-11-03 DIAGNOSIS — D72829 Elevated white blood cell count, unspecified: Secondary | ICD-10-CM | POA: Diagnosis not present

## 2021-11-03 DIAGNOSIS — R079 Chest pain, unspecified: Secondary | ICD-10-CM | POA: Insufficient documentation

## 2021-11-03 DIAGNOSIS — R7309 Other abnormal glucose: Secondary | ICD-10-CM | POA: Insufficient documentation

## 2021-11-03 DIAGNOSIS — E876 Hypokalemia: Secondary | ICD-10-CM | POA: Diagnosis not present

## 2021-11-03 DIAGNOSIS — F419 Anxiety disorder, unspecified: Secondary | ICD-10-CM | POA: Insufficient documentation

## 2021-11-03 DIAGNOSIS — R770 Abnormality of albumin: Secondary | ICD-10-CM | POA: Diagnosis not present

## 2021-11-03 DIAGNOSIS — R519 Headache, unspecified: Secondary | ICD-10-CM | POA: Insufficient documentation

## 2021-11-03 DIAGNOSIS — R112 Nausea with vomiting, unspecified: Secondary | ICD-10-CM

## 2021-11-03 DIAGNOSIS — R1030 Lower abdominal pain, unspecified: Secondary | ICD-10-CM

## 2021-11-03 DIAGNOSIS — R1084 Generalized abdominal pain: Secondary | ICD-10-CM | POA: Insufficient documentation

## 2021-11-03 LAB — COMPREHENSIVE METABOLIC PANEL
ALT: 13 U/L (ref 0–44)
AST: 18 U/L (ref 15–41)
Albumin: 3.3 g/dL — ABNORMAL LOW (ref 3.5–5.0)
Alkaline Phosphatase: 86 U/L (ref 38–126)
Anion gap: 10 (ref 5–15)
BUN: 9 mg/dL (ref 6–20)
CO2: 27 mmol/L (ref 22–32)
Calcium: 8.9 mg/dL (ref 8.9–10.3)
Chloride: 102 mmol/L (ref 98–111)
Creatinine, Ser: 1.07 mg/dL (ref 0.61–1.24)
GFR, Estimated: >60 mL/min (ref >60)
Glucose, Bld: 130 mg/dL — ABNORMAL HIGH (ref 70–99)
Potassium: 3.4 mmol/L — ABNORMAL LOW (ref 3.5–5.1)
Sodium: 139 mmol/L (ref 135–145)
Total Bilirubin: 0.2 mg/dL — ABNORMAL LOW (ref 0.3–1.2)
Total Protein: 6.5 g/dL (ref 6.5–8.1)

## 2021-11-03 LAB — CBC
HCT: 44.5 % (ref 39.0–52.0)
Hemoglobin: 14.6 g/dL (ref 13.0–17.0)
MCH: 29.9 pg (ref 26.0–34.0)
MCHC: 32.8 g/dL (ref 30.0–36.0)
MCV: 91 fL (ref 80.0–100.0)
Platelets: 484 10*3/uL — ABNORMAL HIGH (ref 150–400)
RBC: 4.89 MIL/uL (ref 4.22–5.81)
RDW: 13.5 % (ref 11.5–15.5)
WBC: 16.7 10*3/uL — ABNORMAL HIGH (ref 4.0–10.5)
nRBC: 0 % (ref 0.0–0.2)

## 2021-11-03 LAB — TYPE AND SCREEN
ABO/RH(D): O POS
Antibody Screen: NEGATIVE

## 2021-11-03 LAB — PROTIME-INR
INR: 0.9 (ref 0.8–1.2)
Prothrombin Time: 12.3 seconds (ref 11.4–15.2)

## 2021-11-03 LAB — MAGNESIUM: Magnesium: 2.2 mg/dL (ref 1.7–2.4)

## 2021-11-03 LAB — ABO/RH: ABO/RH(D): O POS

## 2021-11-03 LAB — TROPONIN I (HIGH SENSITIVITY): Troponin I (High Sensitivity): 5 ng/L (ref <18)

## 2021-11-03 LAB — LIPASE, BLOOD: Lipase: 50 U/L (ref 11–51)

## 2021-11-03 LAB — ETHANOL: Alcohol, Ethyl (B): <10 mg/dL (ref <10)

## 2021-11-03 MED ORDER — PANTOPRAZOLE SODIUM 40 MG IV SOLR
40.0000 mg | Freq: Once | INTRAVENOUS | Status: AC
  Administered 2021-11-03: 40 mg via INTRAVENOUS
  Filled 2021-11-03: qty 10

## 2021-11-03 MED ORDER — METOCLOPRAMIDE HCL 5 MG/ML IJ SOLN
10.0000 mg | Freq: Once | INTRAMUSCULAR | Status: AC
  Administered 2021-11-03: 10 mg via INTRAVENOUS
  Filled 2021-11-03: qty 2

## 2021-11-03 MED ORDER — SODIUM CHLORIDE 0.9 % IV BOLUS
1000.0000 mL | Freq: Once | INTRAVENOUS | Status: AC
  Administered 2021-11-03: 1000 mL via INTRAVENOUS

## 2021-11-03 MED ORDER — DIPHENHYDRAMINE HCL 50 MG/ML IJ SOLN
12.5000 mg | Freq: Once | INTRAMUSCULAR | Status: AC
  Administered 2021-11-03: 12.5 mg via INTRAVENOUS
  Filled 2021-11-03: qty 1

## 2021-11-03 NOTE — ED Provider Notes (Signed)
?[facility] EMERGENCY DEPARTMENT ?Provider Note ? ? ?CSN: [id number] ?Arrival date & time: 11/03/21  1924 ? ?  ? ?History ? ?Chief Complaint  ?Patient presents with  ? Abdominal Pain  ? Anxiety  ? ? ?Duane Boone is a 41 y.o. male. ? ?HPI ?Patient is a 41 year old male with a history of a gastric ulcer who presents to the emergency department due to headache, nausea, vomiting, chest pain, shortness of breath, abdominal pain, hematemesis, hematochezia.  Patient states that he has experienced intermittent hematochezia for "many months".  He states that he will typically occur about 3-4 times over the course of a week and then will resolve for about a month before occurring once again.  He states that when he experiences hematochezia he will also experience abdominal pain.  Last night he was drinking alcohol and doing drugs with friends.  He states that he drank about 3 pints of vodka, a 4 Loco, smoked marijuana, and snorted cocaine.  He immediately started experiencing nausea and vomiting.  He states that he has had 3 bouts of coffee-ground emesis over the past 24 hours. ?  ? ?Home Medications ?Prior to Admission medications   ?Medication Sig Start Date End Date Taking? Authorizing Provider  ?pantoprazole (PROTONIX) 20 MG tablet Take 1 tablet (20 mg total) by mouth daily. 04/24/20 06/23/20  Fayrene Helper, PA-C  ?ipratropium (ATROVENT) 0.03 % nasal spray Place 2 sprays into both nostrils every 12 (twelve) hours. ?Patient not taking: Reported on 06/10/2016 02/27/16 04/24/20  Palumbo, April, MD  ?metoCLOPramide (REGLAN) 10 MG tablet Take 1 tablet (10 mg total) by mouth every 6 (six) hours. ?Patient not taking: Reported on 06/10/2016 02/27/16 04/24/20  Palumbo, April, MD  ?omeprazole (PRILOSEC) 20 MG capsule Take 1 capsule (20 mg total) by mouth daily. 06/10/16 03/08/20  Arby Barrette, MD  ?sucralfate (CARAFATE) 1 g tablet Take 1 tablet (1 g total) by mouth 4 (four) times daily -  with meals and at  bedtime. ?Patient not taking: Reported on 04/24/2020 06/10/16 04/24/20  Arby Barrette, MD  ?   ? ?Allergies    ?Patient has no known allergies.   ? ?Review of Systems   ?Review of Systems  ?All other systems reviewed and are negative. ?Ten systems reviewed and are negative for acute change, except as noted in the HPI.   ?Physical Exam ?Updated Vital Signs ?BP 120/89   Pulse 91   Temp 99.5 ?F (37.5 ?C) (Oral)   Resp 16   SpO2 98%  ?Physical Exam ?Vitals and nursing note reviewed.  ?Constitutional:   ?   General: He is not in acute distress. ?   Appearance: Normal appearance. He is well-developed. He is not ill-appearing, toxic-appearing or diaphoretic.  ?HENT:  ?   Head: Normocephalic and atraumatic.  ?   Right Ear: External ear normal.  ?   Left Ear: External ear normal.  ?   Nose: Nose normal.  ?   Mouth/Throat:  ?   Mouth: Mucous membranes are moist.  ?   Pharynx: Oropharynx is clear. No oropharyngeal exudate or posterior oropharyngeal erythema.  ?Eyes:  ?   Extraocular Movements: Extraocular movements intact.  ?Cardiovascular:  ?   Rate and Rhythm: Normal rate and regular rhythm.  ?   Pulses: Normal pulses.  ?   Heart sounds: Normal heart sounds. No murmur heard. ?  No friction rub. No gallop.  ?Pulmonary:  ?   Effort: Pulmonary effort is normal. No respiratory distress.  ?  Breath sounds: Normal breath sounds. No stridor. No wheezing, rhonchi or rales.  ?Abdominal:  ?   General: Abdomen is flat.  ?   Palpations: Abdomen is soft.  ?   Tenderness: There is abdominal tenderness in the right lower quadrant, suprapubic area and left lower quadrant.  ?   Comments: Abdomen is flat and soft.  Mild tenderness noted diffusely across the lower abdomen.  ?Genitourinary: ?   Comments: Patient declined rectal exam. ?Musculoskeletal:     ?   General: Normal range of motion.  ?   Cervical back: Normal range of motion and neck supple. No tenderness.  ?Skin: ?   General: Skin is warm and dry.  ?Neurological:  ?   General:  No focal deficit present.  ?   Mental Status: He is alert and oriented to person, place, and time.  ?Psychiatric:     ?   Mood and Affect: Mood normal.     ?   Behavior: Behavior normal.  ? ?ED Results / Procedures / Treatments   ?Labs ?(all labs ordered are listed, but only abnormal results are displayed) ?Labs Reviewed  ?CBC - Abnormal; Notable for the following components:  ?    Result Value  ? WBC 16.7 (*)   ? Platelets 484 (*)   ? All other components within normal limits  ?COMPREHENSIVE METABOLIC PANEL - Abnormal; Notable for the following components:  ? Potassium 3.4 (*)   ? Glucose, Bld 130 (*)   ? Albumin 3.3 (*)   ? Total Bilirubin 0.2 (*)   ? All other components within normal limits  ?PROTIME-INR  ?LIPASE, BLOOD  ?MAGNESIUM  ?ETHANOL  ?URINALYSIS, ROUTINE W REFLEX MICROSCOPIC  ?RAPID URINE DRUG SCREEN, HOSP PERFORMED  ?POC OCCULT BLOOD, ED  ?TYPE AND SCREEN  ?ABO/RH  ?TROPONIN I (HIGH SENSITIVITY)  ?TROPONIN I (HIGH SENSITIVITY)  ? ?EKG ?None ? ?Radiology ?DG Chest Portable 1 View ? ?Result Date: 11/03/2021 ?CLINICAL DATA:  Chest pain EXAM: PORTABLE CHEST 1 VIEW COMPARISON:  None. FINDINGS: The heart size and mediastinal contours are within normal limits. Both lungs are clear. The visualized skeletal structures are unremarkable. IMPRESSION: No active disease. Electronically Signed   By: Helyn Numbers M.D.   On: 11/03/2021 21:55   ? ?Procedures ?Procedures  ? ?Medications Ordered in ED ?Medications  ?sodium chloride 0.9 % bolus 1,000 mL (1,000 mLs Intravenous New Bag/Given 11/03/21 2202)  ?pantoprazole (PROTONIX) injection 40 mg (40 mg Intravenous Given 11/03/21 2202)  ?diphenhydrAMINE (BENADRYL) injection 12.5 mg (12.5 mg Intravenous Given 11/03/21 2158)  ?metoCLOPramide (REGLAN) injection 10 mg (10 mg Intravenous Given 11/03/21 2158)  ? ?ED Course/ Medical Decision Making/ A&P ?  ?                        ?Medical Decision Making ?Amount and/or Complexity of Data Reviewed ?Labs: ordered. ?Radiology:  ordered. ? ?Risk ?Prescription drug management. ? ?Pt is a 41 y.o. male with a history of a gastric ulcer who presents to the emergency department due to nausea, vomiting, chest pain, abdominal pain, headache.  States he was drinking alcohol with friends last night and had about "3 pints of vodka".  Also states that he smoked marijuana and used intranasal cocaine. ? ?Labs: ?CBC with a white count of 16.7 and platelets of 44. ?CMP with potassium of 3.4, glucose of 130, albumin of 3.3, total bilirubin 0.2. ?Prothrombin time 12.3 with an INR of 0.9. ?Ethanol less than 10. ?Lipase of 50. ?  Magnesium 2.2. ?Troponin of 5. ? ?Imaging: ?Chest x-ray showed no active disease. ?CT scan of the abdomen/pelvis is pending. ? ?I, Placido SouLogan Maven Varelas, PA-C, personally reviewed and evaluated these images and lab results as part of my medical decision-making. ? ?On my initial assessment patient's heart is regular rate and rhythm without murmurs, rubs, or gallops.  Lungs are clear to auscultation bilaterally.  Patient has a low-grade temperature but is nontoxic-appearing.  Abdomen is flat and soft with mild tenderness noted diffusely across the lower abdomen.  Patient refused rectal exam. ? ?Patient's lab work has mostly resulted.  CBC with leukocytosis of 16.7.  Mild hypokalemia 3.4 with a magnesium within normal limits at 2.2.  Lipase within normal limits at 50.  Ethanol less than 10.  Given patient's chest pain and recent cocaine use I obtained a troponin which was within normal limits at 5.  Patient's symptoms were treated with a migraine cocktail including IV fluids, Reglan, Benadryl.  Also added additional Protonix for possible hematemesis.  Patient now sleeping comfortably in bed.  No nausea or vomiting since arrival.  No episodes of hematemesis noted.  H&H within normal limits. ? ?It is the end of my shift and [facility] being [facility].  Patient pending CT scan of the abdomen/pelvis as well as  reassessment. ? ?Note: Portions of this report may have been transcribed using voice recognition software. Every effort was made to ensure accuracy; however, inadvertent computerized transcription errors may be present.  ? ?Final Cl

## 2021-11-03 NOTE — ED Provider Triage Note (Signed)
Emergency Medicine Provider Triage Evaluation Note ? ?MELISSA CLEMENTE , a 41 y.o. male  was evaluated in triage.  Pt complains of lower abdominal pain since last night after drinking excessive amount of alcohol.  Patient reports that him and his "associates" drank 2/5 of [place] vodka.  Patient reports that the last thing he remembers is his "lady friend" picking him up.  Patient states that he vomited twice.  Patient reports that he had blood in his vomit describes as "dark maroon".  Patient also states he has blood in his stool however this is a known problem for him.  Patient endorsing generalized weakness, nausea. ? ?Review of Systems  ?Positive:  ?Negative:  ? ?Physical Exam  ?BP 120/89   Pulse 91   Temp 99.5 ?F (37.5 ?C) (Oral)   Resp 16   SpO2 98%  ?Gen:   Awake, no distress   ?Resp:  Normal effort  ?MSK:   Moves extremities without difficulty  ?Other:  Nonfocal abdominal tenderness ? ?Medical Decision Making  ?Medically screening exam initiated at 8:11 PM.  Appropriate orders placed.  HUNT WALTZER was informed that the remainder of the evaluation will be completed by another provider, this initial triage assessment does not replace that evaluation, and the importance of remaining in the ED until their evaluation is complete. ? ? ?  ?Azucena Cecil, PA-C ?11/03/21 2012 ? ?

## 2021-11-03 NOTE — ED Triage Notes (Signed)
Pt here for lower abd pain w/ nausea and Ha since last night. Pt states him and his associates drank 2 fifths of vodka, he endorses vomiting twice, then woke up w/ anxiety, feels like he was possibly drugged. Pt reports coughing up dark red blood that started today, and having some blood in his stools that has been ongoing on for 3-4 years ?

## 2021-11-04 LAB — URINALYSIS, ROUTINE W REFLEX MICROSCOPIC
Bacteria, UA: NONE SEEN
Bilirubin Urine: NEGATIVE
Glucose, UA: NEGATIVE mg/dL
Ketones, ur: NEGATIVE mg/dL
Leukocytes,Ua: NEGATIVE
Nitrite: NEGATIVE
Protein, ur: NEGATIVE mg/dL
Specific Gravity, Urine: 1.02 (ref 1.005–1.030)
pH: 5 (ref 5.0–8.0)

## 2021-11-04 LAB — RAPID URINE DRUG SCREEN, HOSP PERFORMED
Amphetamines: NOT DETECTED
Barbiturates: NOT DETECTED
Benzodiazepines: NOT DETECTED
Cocaine: POSITIVE — AB
Opiates: NOT DETECTED
Tetrahydrocannabinol: POSITIVE — AB

## 2021-11-04 LAB — TROPONIN I (HIGH SENSITIVITY): Troponin I (High Sensitivity): 5 ng/L (ref <18)

## 2021-11-04 MED ORDER — ONDANSETRON HCL 4 MG PO TABS: 4.0000 mg | ORAL_TABLET | Freq: Four times a day (QID) | ORAL | 0 refills | Status: AC

## 2021-11-04 MED ORDER — IOHEXOL 300 MG/ML  SOLN
100.0000 mL | Freq: Once | INTRAMUSCULAR | Status: AC | PRN
  Administered 2021-11-04: 100 mL via INTRAVENOUS

## 2021-11-04 NOTE — ED Provider Notes (Signed)
Received at shift change from [facility] please review his note for full detail ? ?Patient with medical history including gastric ulcer presents with complaints of a headache nausea vomiting chest pain shortness of breath abdominal pain states been having intermittent hematochezia for many months will have 3-4 episodes over weeks time then resolve and then come back the following month, he endorses that he drank 3 pints of vodka with friends smoked marijuana snort cocaine last night and now has been experiencing nausea vomiting endorses 3 episodes of coffee-ground emesis has had none today. ? ?Per previous provider follow-up on lab work and scans and treat accordingly.  Likely disposition with outpatient GI follow-up ? ?Lab work-I personally reviewed the lab work in the acute abnormalities are CBC shows a glucose of 16.7, CMP shows potassium 3.4 glucose 130 albumin 3.3 total T. bili 0.2, prothrombin time INR unremarkable ethanol less than 10 lipase 50 magnesium 2.2 first troponin is 5, second troponin was 5, rapid urine drug screen positive for cocaine, tetra cannabinoids, UA is unremarkable ? ?Imaging-personally reviewed the imaging CT abdomen pelvis which was negative for any acute findings. ? ?Updated patient on lab work imaging, he was found resting comfortably, having no complaints, he is ready for discharge. ? ?Low suspicion for ACS negative delta troponin EKG without signs of ischemia.  Low suspicion for GI bleed hemoglobin is within normal limits, patient did defer on rectal exam but his vital signs remained stable has not nonsurgical abdomen making this less likely.  He also had no emesis during his visit, nonsurgical abdomen Less likely. ? ?Disposition ? ?Intermittent rectal bleeding-unclear but likely secondary due to hemorrhoids, will have him follow-up with GI for further evaluation. ? ?Nausea vomiting abdominal pain-likely gastroenteritis secondary due to alcohol use, will have him refrain from this,  continue with his antiacid pills follow-up with GI. ? ?  ?Carroll Sage, PA-C ?11/04/21 4158 ? ?  ?Shon Baton, MD ?11/04/21 0246 ? ?

## 2021-11-04 NOTE — Discharge Instructions (Addendum)
Lab work and imaging was all reassuring please: Continue with antiacid pills.  Please refrain from drinking alcohol having carbonated drinks acidic/spicy foods this will worsen stomach pains ? ?Please follow-up with GI ? ?Come back to the emergency department if you develop chest pain, shortness of breath, severe abdominal pain, uncontrolled nausea, vomiting, diarrhea. ? ?

## 2022-09-10 DEATH — deceased
